# Patient Record
Sex: Female | Born: 1985 | Race: White | Hispanic: No | Marital: Married
Health system: Southern US, Community
[De-identification: ages and names within clinical notes are randomized; demographics above are authoritative.]

## PROBLEM LIST (undated history)

## (undated) ENCOUNTER — Inpatient Hospital Stay (HOSPITAL_COMMUNITY): Payer: Self-pay

## (undated) DIAGNOSIS — G809 Cerebral palsy, unspecified: Secondary | ICD-10-CM

## (undated) DIAGNOSIS — E0590 Thyrotoxicosis, unspecified without thyrotoxic crisis or storm: Secondary | ICD-10-CM

## (undated) DIAGNOSIS — O139 Gestational [pregnancy-induced] hypertension without significant proteinuria, unspecified trimester: Secondary | ICD-10-CM

## (undated) DIAGNOSIS — L709 Acne, unspecified: Secondary | ICD-10-CM

## (undated) DIAGNOSIS — E039 Hypothyroidism, unspecified: Secondary | ICD-10-CM

## (undated) DIAGNOSIS — Z8759 Personal history of other complications of pregnancy, childbirth and the puerperium: Secondary | ICD-10-CM

## (undated) DIAGNOSIS — J4520 Mild intermittent asthma, uncomplicated: Secondary | ICD-10-CM

## (undated) HISTORY — DX: Mild intermittent asthma, uncomplicated: J45.20

## (undated) HISTORY — DX: Personal history of other complications of pregnancy, childbirth and the puerperium: Z87.59

## (undated) HISTORY — PX: OTHER SURGICAL HISTORY: SHX169

## (undated) HISTORY — DX: Acne, unspecified: L70.9

---

## 2017-04-05 ENCOUNTER — Encounter: Payer: Self-pay | Admitting: Obstetrics and Gynecology

## 2017-04-05 ENCOUNTER — Ambulatory Visit (INDEPENDENT_AMBULATORY_CARE_PROVIDER_SITE_OTHER): Payer: BLUE CROSS/BLUE SHIELD | Admitting: Obstetrics and Gynecology

## 2017-04-05 VITALS — BP 123/77 | HR 77 | Ht 62.0 in | Wt 155.6 lb

## 2017-04-05 DIAGNOSIS — Z01419 Encounter for gynecological examination (general) (routine) without abnormal findings: Secondary | ICD-10-CM

## 2017-04-05 MED ORDER — NORELGESTROMIN-ETH ESTRADIOL 150-35 MCG/24HR TD PTWK: 1.0000 | MEDICATED_PATCH | TRANSDERMAL | 5 refills | Status: DC

## 2017-04-05 NOTE — Progress Notes (Signed)
Patient is in the office to get her annual exam and discuss birth control. Patient is interested in a long term birth control- she is getting married soon.

## 2017-04-05 NOTE — Progress Notes (Signed)
Subjective:     Nicole Vance is a 31 y.o. female G0 with BMI 28 who is here for a comprehensive physical exam. The patient reports no problems. She is sexually active using condoms for contraception. She desires STD testing. She denies pelvic pain or abnormal discharge. She reports a monthly period lasting 4-6 days. She is interested in contraception at this time. She has taken OCP without complications in the past. She is requesting STD screen. Patient is getting married on 05/01/2017  Past Medical History:  Diagnosis Date  . Acne   . Asthma in adult, mild intermittent, uncomplicated    with exercise   Past Surgical History:  Procedure Laterality Date  . oral surgery     Family History  Problem Relation Age of Onset  . Diabetes Mellitus II Paternal Grandmother   . Diabetes Mellitus II Paternal Grandfather     Social History   Social History  . Marital status: Single    Spouse name: N/A  . Number of children: N/A  . Years of education: N/A   Occupational History  . Not on file.   Social History Main Topics  . Smoking status: Never Smoker  . Smokeless tobacco: Never Used  . Alcohol use 1.2 oz/week    2 Glasses of wine per week  . Drug use: No  . Sexual activity: Yes    Partners: Male    Birth control/ protection: Condom   Other Topics Concern  . Not on file   Social History Narrative  . No narrative on file   Health Maintenance  Topic Date Due  . HIV Screening  08/29/2003  . TETANUS/TDAP  08/29/2007  . PAP SMEAR  08/28/2009  . INFLUENZA VACCINE  07/07/2017       Review of Systems Pertinent items are noted in HPI.   Objective:  Blood pressure 123/77, pulse 77, height  (1.575 m), weight 155 lb 9.6 oz (70.6 kg), last menstrual period 03/16/2017.     GENERAL: Well-developed, well-nourished female in no acute distress.  HEENT: Normocephalic, atraumatic. Sclerae anicteric.  NECK: Supple. Normal thyroid.  LUNGS: Clear to auscultation bilaterally.   HEART: Regular rate and rhythm. BREASTS: Symmetric in size. No palpable masses or lymphadenopathy, skin changes, or nipple drainage. ABDOMEN: Soft, nontender, nondistended.  PELVIC: Normal external female genitalia. Vagina is pink and rugated.  Normal discharge. Normal appearing cervix. Uterus is normal in size. No adnexal mass or tenderness. EXTREMITIES: No cyanosis, clubbing, or edema, 2+ distal pulses.    Assessment:    Healthy female exam.      Plan:    pap smear collected STD screen ordered Patient will be contacted with abnormal results NuvaRing sample provided to try. Patient is interested in [facility]. Rx also provided but patient may call to request Rx for NuvaRing instead See After Visit Summary for Counseling Recommendations

## 2017-04-06 LAB — RPR: RPR Ser Ql: NONREACTIVE

## 2017-04-06 LAB — CERVICOVAGINAL ANCILLARY ONLY
Bacterial vaginitis: NEGATIVE
CANDIDA VAGINITIS: NEGATIVE
CHLAMYDIA, DNA PROBE: NEGATIVE
NEISSERIA GONORRHEA: NEGATIVE
TRICH (WINDOWPATH): NEGATIVE

## 2017-04-06 LAB — CYTOLOGY - PAP: Diagnosis: NEGATIVE

## 2017-04-06 LAB — HEPATITIS C ANTIBODY: Hep C Virus Ab: <0.1 s/co ratio (ref 0.0–0.9)

## 2017-04-06 LAB — HIV ANTIBODY (ROUTINE TESTING W REFLEX): HIV Screen 4th Generation wRfx: NONREACTIVE

## 2017-04-06 LAB — HEPATITIS B SURFACE ANTIGEN: Hepatitis B Surface Ag: NEGATIVE

## 2017-04-07 DIAGNOSIS — L219 Seborrheic dermatitis, unspecified: Secondary | ICD-10-CM | POA: Diagnosis not present

## 2017-04-07 DIAGNOSIS — L700 Acne vulgaris: Secondary | ICD-10-CM | POA: Diagnosis not present

## 2017-04-07 DIAGNOSIS — Z79899 Other long term (current) drug therapy: Secondary | ICD-10-CM | POA: Diagnosis not present

## 2017-07-25 DIAGNOSIS — J029 Acute pharyngitis, unspecified: Secondary | ICD-10-CM | POA: Diagnosis not present

## 2017-11-01 DIAGNOSIS — J00 Acute nasopharyngitis [common cold]: Secondary | ICD-10-CM | POA: Diagnosis not present

## 2018-06-15 DIAGNOSIS — Z411 Encounter for cosmetic surgery: Secondary | ICD-10-CM | POA: Diagnosis not present

## 2018-06-15 DIAGNOSIS — L738 Other specified follicular disorders: Secondary | ICD-10-CM | POA: Diagnosis not present

## 2018-10-11 DIAGNOSIS — J00 Acute nasopharyngitis [common cold]: Secondary | ICD-10-CM | POA: Diagnosis not present

## 2018-10-11 DIAGNOSIS — Z23 Encounter for immunization: Secondary | ICD-10-CM | POA: Diagnosis not present

## 2019-03-22 DIAGNOSIS — N3000 Acute cystitis without hematuria: Secondary | ICD-10-CM | POA: Diagnosis not present

## 2019-08-08 DIAGNOSIS — Z01419 Encounter for gynecological examination (general) (routine) without abnormal findings: Secondary | ICD-10-CM | POA: Diagnosis not present

## 2019-09-07 DIAGNOSIS — M25531 Pain in right wrist: Secondary | ICD-10-CM | POA: Diagnosis not present

## 2019-09-07 DIAGNOSIS — M65831 Other synovitis and tenosynovitis, right forearm: Secondary | ICD-10-CM | POA: Diagnosis not present

## 2019-09-07 DIAGNOSIS — M65839 Other synovitis and tenosynovitis, unspecified forearm: Secondary | ICD-10-CM | POA: Diagnosis not present

## 2019-10-09 DIAGNOSIS — M65831 Other synovitis and tenosynovitis, right forearm: Secondary | ICD-10-CM | POA: Diagnosis not present

## 2019-10-09 DIAGNOSIS — M25531 Pain in right wrist: Secondary | ICD-10-CM | POA: Diagnosis not present

## 2020-08-29 LAB — OB RESULTS CONSOLE RPR: RPR: NONREACTIVE

## 2020-08-29 LAB — OB RESULTS CONSOLE GC/CHLAMYDIA
Chlamydia: NEGATIVE
Gonorrhea: NEGATIVE

## 2020-08-29 LAB — OB RESULTS CONSOLE HIV ANTIBODY (ROUTINE TESTING): HIV: NONREACTIVE

## 2020-08-29 LAB — OB RESULTS CONSOLE RUBELLA ANTIBODY, IGM: Rubella: IMMUNE

## 2020-08-29 LAB — OB RESULTS CONSOLE HEPATITIS B SURFACE ANTIGEN: Hepatitis B Surface Ag: NEGATIVE

## 2020-12-07 NOTE — L&D Delivery Note (Signed)
Patient was C/C/+2 and pushed for approximately 2 hours with epidural.  As baby was crowning, fetal heart tones were suspected to drop to the 90s, episiotomy was performed to expedite delivery d/t soft tissue dytocia.  Baby delivered with one push after this was performed. Nuchal cord x 1 noted.  He was bulb suctioned for thick mucus and place on maternal abdomen for drying. Delayed cord clamping done for <30 seconds while warming baby. He remained somewhat limp without spontaneous cry, so cord was clamped and cut and infant was handed off to awaiting neonatologist for add'l care. NSVD female infant "[name]", Apgars 6/8, weight 5#13.   The patient had R mediolateral episiotomy repaired with 2-0 vicryl and a left outer labial laceration repaired with 3-0 vicryl. Fundus was firm. EBL was expected amount. Placenta was delivered intact. Vagina was clear.    Nicole Vance

## 2021-02-18 ENCOUNTER — Inpatient Hospital Stay (HOSPITAL_COMMUNITY)
Admission: AD | Admit: 2021-02-18 | Discharge: 2021-02-25 | DRG: 806 | Disposition: A | Discharge status: Home / Self Care | Payer: 59 | Attending: Obstetrics and Gynecology | Admitting: Obstetrics and Gynecology

## 2021-02-18 ENCOUNTER — Encounter (HOSPITAL_COMMUNITY): Payer: Self-pay

## 2021-02-18 DIAGNOSIS — O1413 Severe pre-eclampsia, third trimester: Principal | ICD-10-CM | POA: Diagnosis present

## 2021-02-18 DIAGNOSIS — Z3490 Encounter for supervision of normal pregnancy, unspecified, unspecified trimester: Secondary | ICD-10-CM

## 2021-02-18 DIAGNOSIS — O99354 Diseases of the nervous system complicating childbirth: Secondary | ICD-10-CM | POA: Diagnosis present

## 2021-02-18 DIAGNOSIS — O1414 Severe pre-eclampsia complicating childbirth: Principal | ICD-10-CM | POA: Diagnosis present

## 2021-02-18 DIAGNOSIS — G809 Cerebral palsy, unspecified: Secondary | ICD-10-CM | POA: Diagnosis present

## 2021-02-18 DIAGNOSIS — Z3A35 35 weeks gestation of pregnancy: Secondary | ICD-10-CM

## 2021-02-18 DIAGNOSIS — R030 Elevated blood-pressure reading, without diagnosis of hypertension: Secondary | ICD-10-CM | POA: Diagnosis present

## 2021-02-18 HISTORY — DX: Cerebral palsy, unspecified: G80.9

## 2021-02-18 LAB — URINALYSIS, ROUTINE W REFLEX MICROSCOPIC
Bilirubin Urine: NEGATIVE
Glucose, UA: NEGATIVE mg/dL
Hgb urine dipstick: NEGATIVE
Ketones, ur: NEGATIVE mg/dL
Nitrite: NEGATIVE
Protein, ur: NEGATIVE mg/dL
Specific Gravity, Urine: 1.008 (ref 1.005–1.030)
pH: 7 (ref 5.0–8.0)

## 2021-02-18 LAB — COMPREHENSIVE METABOLIC PANEL
ALT: 17 U/L (ref 0–44)
AST: 19 U/L (ref 15–41)
Albumin: 3.1 g/dL — ABNORMAL LOW (ref 3.5–5.0)
Alkaline Phosphatase: 79 U/L (ref 38–126)
Anion gap: 9 (ref 5–15)
BUN: 11 mg/dL (ref 6–20)
CO2: 20 mmol/L — ABNORMAL LOW (ref 22–32)
Calcium: 9.8 mg/dL (ref 8.9–10.3)
Chloride: 104 mmol/L (ref 98–111)
Creatinine, Ser: 0.78 mg/dL (ref 0.44–1.00)
GFR, Estimated: >60 mL/min (ref >60)
Glucose, Bld: 87 mg/dL (ref 70–99)
Potassium: 3.7 mmol/L (ref 3.5–5.1)
Sodium: 133 mmol/L — ABNORMAL LOW (ref 135–145)
Total Bilirubin: 0.4 mg/dL (ref 0.3–1.2)
Total Protein: 6.1 g/dL — ABNORMAL LOW (ref 6.5–8.1)

## 2021-02-18 LAB — CBC
HCT: 34 % — ABNORMAL LOW (ref 36.0–46.0)
Hemoglobin: 12.3 g/dL (ref 12.0–15.0)
MCH: 32.4 pg (ref 26.0–34.0)
MCHC: 36.2 g/dL — ABNORMAL HIGH (ref 30.0–36.0)
MCV: 89.5 fL (ref 80.0–100.0)
Platelets: 200 10*3/uL (ref 150–400)
RBC: 3.8 MIL/uL — ABNORMAL LOW (ref 3.87–5.11)
RDW: 12.6 % (ref 11.5–15.5)
WBC: 7 10*3/uL (ref 4.0–10.5)
nRBC: 0 % (ref 0.0–0.2)

## 2021-02-18 LAB — TYPE AND SCREEN
ABO/RH(D): O POS
Antibody Screen: NEGATIVE

## 2021-02-18 LAB — PROTEIN / CREATININE RATIO, URINE
Creatinine, Urine: 54.87 mg/dL
Total Protein, Urine: <6 mg/dL

## 2021-02-18 MED ORDER — ZOLPIDEM TARTRATE 5 MG PO TABS: 5.0000 mg | ORAL_TABLET | Freq: Every evening | ORAL | Status: DC | PRN

## 2021-02-18 MED ORDER — ACETAMINOPHEN 325 MG PO TABS
650.0000 mg | ORAL_TABLET | ORAL | Status: DC | PRN
  Administered 2021-02-19 – 2021-02-22 (×4): 650 mg via ORAL
  Filled 2021-02-18 (×4): qty 2

## 2021-02-18 MED ORDER — LABETALOL HCL 5 MG/ML IV SOLN
INTRAVENOUS | Status: AC
  Administered 2021-02-19: 20 mg via INTRAVENOUS
  Filled 2021-02-18: qty 4

## 2021-02-18 MED ORDER — CALCIUM CARBONATE ANTACID 500 MG PO CHEW: 2.0000 | CHEWABLE_TABLET | ORAL | Status: DC | PRN

## 2021-02-18 MED ORDER — HYDRALAZINE HCL 20 MG/ML IJ SOLN
5.0000 mg | INTRAMUSCULAR | Status: DC | PRN
  Administered 2021-02-19: 5 mg via INTRAVENOUS
  Filled 2021-02-18: qty 1

## 2021-02-18 MED ORDER — DOCUSATE SODIUM 100 MG PO CAPS
100.0000 mg | ORAL_CAPSULE | Freq: Every day | ORAL | Status: DC
  Administered 2021-02-21: 100 mg via ORAL
  Filled 2021-02-18: qty 1

## 2021-02-18 MED ORDER — LABETALOL HCL 5 MG/ML IV SOLN
20.0000 mg | INTRAVENOUS | Status: DC | PRN
  Administered 2021-02-18: 20 mg via INTRAVENOUS

## 2021-02-18 MED ORDER — PRENATAL MULTIVITAMIN CH
1.0000 | ORAL_TABLET | Freq: Every day | ORAL | Status: DC
Start: 2021-02-19 — End: 2021-02-21
  Administered 2021-02-20: 1 via ORAL
  Filled 2021-02-18: qty 1

## 2021-02-18 MED ORDER — LABETALOL HCL 5 MG/ML IV SOLN
40.0000 mg | INTRAVENOUS | Status: DC | PRN
  Administered 2021-02-18: 40 mg via INTRAVENOUS
  Filled 2021-02-18: qty 8

## 2021-02-18 MED ORDER — HYDRALAZINE HCL 20 MG/ML IJ SOLN
10.0000 mg | INTRAMUSCULAR | Status: DC | PRN
  Administered 2021-02-19: 10 mg via INTRAVENOUS
  Filled 2021-02-18: qty 1

## 2021-02-18 NOTE — MAU Provider Note (Addendum)
Chief Complaint:  Hypertension   Event Date/Time   First Provider Initiated Contact with Patient 02/18/21 2050     HPI: Nicole Vance is a 34 y.o. G1P0 at 35w3dwho presents to maternity admissions reporting elevated blood pressure at home, severe range.  She reports good fetal movement, denies LOF, vaginal bleeding, vaginal itching/burning, urinary symptoms, h/a, dizziness, n/v, diarrhea, constipation or fever/chills.  She denies headache, visual changes or RUQ abdominal pain.  Hypertension This is a recurrent problem. The current episode started today. The problem has been gradually worsening since onset. Pertinent negatives include no anxiety, blurred vision, chest pain, headaches (has had some "very mild ones", none now), malaise/fatigue or peripheral edema. There are no associated agents to hypertension. There are no known risk factors for coronary artery disease. Past treatments include nothing. There are no compliance problems.     RN Note: Nicole Vance is a 34 y.o. at Unknown here in MAU reporting: took blood pressure at home and it was 168/106. Denies headache, visual changes, or epigastric pain. +FM. Denies vaginal bleeding, leaking of fluid, or contractions.  Pain score: 0/10  Past Medical History: Has had some mild elevations of BP in office recently  Past obstetric history: OB History  Gravida Para Term Preterm AB Living  1            SAB IAB Ectopic Multiple Live Births               # Outcome Date GA Lbr Len/2nd Weight Sex Delivery Anes PTL Lv  1 Current             Past Surgical History: History reviewed. No pertinent surgical history.  Family History: History reviewed. No pertinent family history.  Social History:    Allergies: No Known Allergies  Meds:  No medications prior to admission.    I have reviewed patient's Past Medical Hx, Surgical Hx, Family Hx, Social Hx, medications and allergies.   ROS:  Review of Systems  Constitutional: Negative for  malaise/fatigue.  Eyes: Negative for blurred vision.  Cardiovascular: Negative for chest pain.  Neurological: Negative for headaches (has had some "very mild ones", none now).   Other systems negative  Physical Exam   Patient Vitals for the past 24 hrs:  BP Temp Temp src Pulse Resp SpO2 Height Weight  02/18/21 2140 -- -- -- -- -- 99 % -- --  02/18/21 2135 -- -- -- -- -- 98 % -- --  02/18/21 2131 (!) 163/101 -- -- 87 -- -- -- --  02/18/21 2130 -- -- -- -- -- 98 % -- --  02/18/21 2125 -- -- -- -- -- 97 % -- --  02/18/21 2120 -- -- -- -- -- 98 % -- --  02/18/21 2116 (!) 172/109 -- -- 99 -- -- -- --  02/18/21 2115 -- -- -- -- -- 98 % -- --  02/18/21 2110 -- -- -- -- -- 97 % -- --  02/18/21 2105 -- -- -- -- -- 96 % -- --  02/18/21 2101 (!) 169/103 -- -- 98 -- -- -- --  02/18/21 2100 -- -- -- -- -- 96 % -- --  02/18/21 2045 (!) 170/104 -- -- -- -- -- -- --  02/18/21 2042 (!) 175/111 98.5 F (36.9 C) Oral 97 17 98 % 5' 2" (1.575 m) 91.7 kg   Constitutional: Well-developed, well-nourished female in no acute distress.  Cardiovascular: normal rate and rhythm Respiratory: normal effort GI: Abd soft, non-tender, gravid appropriate   for gestational age.   No rebound or guarding. MS: Extremities nontender, Trace pedal edema, normal ROM Neurologic: Alert and oriented x 4. DTRs 2-3+ with no clonus GU: Neg CVAT.  PELVIC EXAM: deferred   FHT:  Baseline 140 , moderate variability, accelerations present, no decelerations Contractions: Uterine irritability   Labs: Results for orders placed or performed during the hospital encounter of 02/18/21 (from the past 24 hour(s))  CBC     Status: Abnormal   Collection Time: 02/18/21  8:36 PM  Result Value Ref Range   WBC 7.0 4.0 - 10.5 K/uL   RBC 3.80 (L) 3.87 - 5.11 MIL/uL   Hemoglobin 12.3 12.0 - 15.0 g/dL   HCT 34.0 (L) 36.0 - 46.0 %   MCV 89.5 80.0 - 100.0 fL   MCH 32.4 26.0 - 34.0 pg   MCHC 36.2 (H) 30.0 - 36.0 g/dL   RDW 12.6 11.5 - 15.5 %    Platelets 200 150 - 400 K/uL   nRBC 0.0 0.0 - 0.2 %  Comprehensive metabolic panel     Status: Abnormal   Collection Time: 02/18/21  8:36 PM  Result Value Ref Range   Sodium 133 (L) 135 - 145 mmol/L   Potassium 3.7 3.5 - 5.1 mmol/L   Chloride 104 98 - 111 mmol/L   CO2 20 (L) 22 - 32 mmol/L   Glucose, Bld 87 70 - 99 mg/dL   BUN 11 6 - 20 mg/dL   Creatinine, Ser 0.78 0.44 - 1.00 mg/dL   Calcium 9.8 8.9 - 10.3 mg/dL   Total Protein 6.1 (L) 6.5 - 8.1 g/dL   Albumin 3.1 (L) 3.5 - 5.0 g/dL   AST 19 15 - 41 U/L   ALT 17 0 - 44 U/L   Alkaline Phosphatase 79 38 - 126 U/L   Total Bilirubin 0.4 0.3 - 1.2 mg/dL   GFR, Estimated >60 >60 mL/min   Anion gap 9 5 - 15  Type and screen     Status: None (Preliminary result)   Collection Time: 02/18/21  9:25 PM  Result Value Ref Range   ABO/RH(D) PENDING    Antibody Screen PENDING    Sample Expiration      02/21/2021,2359 Performed at [facility], [address] [place], [place] [address]    Protein/creat Ratio pending  Imaging:  No results found.  MAU Course/MDM: I have ordered labs and reviewed results. CMET and protein pending NST reviewed, reactive Consult Dr Horvath with presentation, exam findings and test results.  Treatments in MAU included Severe Preeclampsia Protocol.    Assessment: 1. Preeclampsia, severe, third trimester   2.     Single IUP at [redacted]w[redacted]d   Plan: Admit to OB Specialty Care Unit WIll observe overnight and MD will reevaluate in AM MD to follow    CNM, MSN Certified Nurse-Midwife 02/18/2021 10:07 PM   

## 2021-02-18 NOTE — MAU Note (Signed)
..  Nicole Vance is a 35 y.o. at Unknown here in MAU reporting: took blood pressure at home and it was 168/106. Denies headache, visual changes, or epigastric pain. +FM. Denies vaginal bleeding, leaking of fluid, or contractions.  Pain score: 0/10 Vitals:   02/18/21 2042 02/18/21 2045  BP: (!) 175/111 (!) 170/104  Pulse: 97   Resp: 17   Temp: 98.5 F (36.9 C)   SpO2: 98%      FHT: 140 doppler  Lab orders placed from triage: UA

## 2021-02-18 NOTE — H&P (Signed)
Chief Complaint:  Hypertension   Event Date/Time   First Provider Initiated Contact with Patient 02/18/21 2050     HPI: Nicole Vance is a 35 y.o. G1P0 at 45w3dwho presents to maternity admissions reporting elevated blood pressure at home, severe range.  She reports good fetal movement, denies LOF, vaginal bleeding, vaginal itching/burning, urinary symptoms, h/a, dizziness, n/v, diarrhea, constipation or fever/chills.  She denies headache, visual changes or RUQ abdominal pain.  Hypertension This is a recurrent problem. The current episode started today. The problem has been gradually worsening since onset. Pertinent negatives include no anxiety, blurred vision, chest pain, headaches (has had some "very mild ones", none now), malaise/fatigue or peripheral edema. There are no associated agents to hypertension. There are no known risk factors for coronary artery disease. Past treatments include nothing. There are no compliance problems.     RN Note: Nicole Vance is a 35 y.o. at Unknown here in MAU reporting: took blood pressure at home and it was 168/106. Denies headache, visual changes, or epigastric pain. +FM. Denies vaginal bleeding, leaking of fluid, or contractions.  Pain score: 0/10  Past Medical History: Has had some mild elevations of BP in office recently  Past obstetric history: OB History  Gravida Para Term Preterm AB Living  1            SAB IAB Ectopic Multiple Live Births               # Outcome Date GA Lbr Len/2nd Weight Sex Delivery Anes PTL Lv  1 Current             Past Surgical History: History reviewed. No pertinent surgical history.  Family History: History reviewed. No pertinent family history.  Social History:    Allergies: No Known Allergies  Meds:  No medications prior to admission.    I have reviewed patient's Past Medical Hx, Surgical Hx, Family Hx, Social Hx, medications and allergies.   ROS:  Review of Systems  Constitutional: Negative for  malaise/fatigue.  Eyes: Negative for blurred vision.  Cardiovascular: Negative for chest pain.  Neurological: Negative for headaches (has had some "very mild ones", none now).   Other systems negative  Physical Exam   Patient Vitals for the past 24 hrs:  BP Temp Temp src Pulse Resp SpO2 Height Weight  02/18/21 2140 -- -- -- -- -- 99 % -- --  02/18/21 2135 -- -- -- -- -- 98 % -- --  02/18/21 2131 (!) 163/101 -- -- 87 -- -- -- --  02/18/21 2130 -- -- -- -- -- 98 % -- --  02/18/21 2125 -- -- -- -- -- 97 % -- --  02/18/21 2120 -- -- -- -- -- 98 % -- --  02/18/21 2116 (!) 172/109 -- -- 99 -- -- -- --  02/18/21 2115 -- -- -- -- -- 98 % -- --  02/18/21 2110 -- -- -- -- -- 97 % -- --  02/18/21 2105 -- -- -- -- -- 96 % -- --  02/18/21 2101 (!) 169/103 -- -- 98 -- -- -- --  02/18/21 2100 -- -- -- -- -- 96 % -- --  02/18/21 2045 (!) 170/104 -- -- -- -- -- -- --  02/18/21 2042 (!) 175/111 98.5 F (36.9 C) Oral 97 17 98 % 5\' 2"  (1.575 m) 91.7 kg   Constitutional: Well-developed, well-nourished female in no acute distress.  Cardiovascular: normal rate and rhythm Respiratory: normal effort GI: Abd soft, non-tender, gravid appropriate  for gestational age.   No rebound or guarding. MS: Extremities nontender, Trace pedal edema, normal ROM Neurologic: Alert and oriented x 4. DTRs 2-3+ with no clonus GU: Neg CVAT.  PELVIC EXAM: deferred   FHT:  Baseline 140 , moderate variability, accelerations present, no decelerations Contractions: Uterine irritability   Labs: Results for orders placed or performed during the hospital encounter of 02/18/21 (from the past 24 hour(s))  CBC     Status: Abnormal   Collection Time: 02/18/21  8:36 PM  Result Value Ref Range   WBC 7.0 4.0 - 10.5 K/uL   RBC 3.80 (L) 3.87 - 5.11 MIL/uL   Hemoglobin 12.3 12.0 - 15.0 g/dL   HCT 45.6 (L) 25.6 - 38.9 %   MCV 89.5 80.0 - 100.0 fL   MCH 32.4 26.0 - 34.0 pg   MCHC 36.2 (H) 30.0 - 36.0 g/dL   RDW 37.3 42.8 - 76.8 %    Platelets 200 150 - 400 K/uL   nRBC 0.0 0.0 - 0.2 %  Comprehensive metabolic panel     Status: Abnormal   Collection Time: 02/18/21  8:36 PM  Result Value Ref Range   Sodium 133 (L) 135 - 145 mmol/L   Potassium 3.7 3.5 - 5.1 mmol/L   Chloride 104 98 - 111 mmol/L   CO2 20 (L) 22 - 32 mmol/L   Glucose, Bld 87 70 - 99 mg/dL   BUN 11 6 - 20 mg/dL   Creatinine, Ser 1.15 0.44 - 1.00 mg/dL   Calcium 9.8 8.9 - 72.6 mg/dL   Total Protein 6.1 (L) 6.5 - 8.1 g/dL   Albumin 3.1 (L) 3.5 - 5.0 g/dL   AST 19 15 - 41 U/L   ALT 17 0 - 44 U/L   Alkaline Phosphatase 79 38 - 126 U/L   Total Bilirubin 0.4 0.3 - 1.2 mg/dL   GFR, Estimated >20 >35 mL/min   Anion gap 9 5 - 15  Type and screen     Status: None (Preliminary result)   Collection Time: 02/18/21  9:25 PM  Result Value Ref Range   ABO/RH(D) PENDING    Antibody Screen PENDING    Sample Expiration      02/21/2021,2359 Performed at [facility], [address], [place], [place] [address]    Protein/creat Ratio pending  Imaging:  No results found.  MAU Course/MDM: I have ordered labs and reviewed results. CMET and protein pending NST reviewed, reactive Consult Dr Henderson Cloud with presentation, exam findings and test results.  Treatments in MAU included Severe Preeclampsia Protocol.    Assessment: 1. Preeclampsia, severe, third trimester   2.     Single IUP at [redacted]w[redacted]d   Plan: Admit to [facility] Special[facility] Care Unit WIll observe overnight and MD will reevaluate in AM MD to follow  [name] CNM, MSN Certified Nurse-Midwife 02/18/2021 10:07 PM

## 2021-02-19 ENCOUNTER — Encounter (HOSPITAL_COMMUNITY): Payer: Self-pay | Admitting: Obstetrics and Gynecology

## 2021-02-19 ENCOUNTER — Other Ambulatory Visit: Payer: Self-pay

## 2021-02-19 DIAGNOSIS — Z3490 Encounter for supervision of normal pregnancy, unspecified, unspecified trimester: Secondary | ICD-10-CM

## 2021-02-19 LAB — COMPREHENSIVE METABOLIC PANEL
ALT: 17 U/L (ref 0–44)
ALT: 17 U/L (ref 0–44)
AST: 22 U/L (ref 15–41)
AST: 18 U/L (ref 15–41)
Albumin: 3.1 g/dL — ABNORMAL LOW (ref 3.5–5.0)
Albumin: 3 g/dL — ABNORMAL LOW (ref 3.5–5.0)
Alkaline Phosphatase: 83 U/L (ref 38–126)
Alkaline Phosphatase: 72 U/L (ref 38–126)
Anion gap: 11 (ref 5–15)
Anion gap: 10 (ref 5–15)
BUN: <5 mg/dL — ABNORMAL LOW (ref 6–20)
BUN: <5 mg/dL — ABNORMAL LOW (ref 6–20)
CO2: 19 mmol/L — ABNORMAL LOW (ref 22–32)
CO2: 21 mmol/L — ABNORMAL LOW (ref 22–32)
Calcium: 8.9 mg/dL (ref 8.9–10.3)
Calcium: 7.7 mg/dL — ABNORMAL LOW (ref 8.9–10.3)
Chloride: 105 mmol/L (ref 98–111)
Chloride: 102 mmol/L (ref 98–111)
Creatinine, Ser: 0.6 mg/dL (ref 0.44–1.00)
Creatinine, Ser: 0.7 mg/dL (ref 0.44–1.00)
GFR, Estimated: >60 mL/min (ref >60)
GFR, Estimated: >60 mL/min (ref >60)
Glucose, Bld: 103 mg/dL — ABNORMAL HIGH (ref 70–99)
Glucose, Bld: 98 mg/dL (ref 70–99)
Potassium: 3.9 mmol/L (ref 3.5–5.1)
Potassium: 4.2 mmol/L (ref 3.5–5.1)
Sodium: 135 mmol/L (ref 135–145)
Sodium: 133 mmol/L — ABNORMAL LOW (ref 135–145)
Total Bilirubin: 0.5 mg/dL (ref 0.3–1.2)
Total Bilirubin: 0.5 mg/dL (ref 0.3–1.2)
Total Protein: 6.3 g/dL — ABNORMAL LOW (ref 6.5–8.1)
Total Protein: 6.1 g/dL — ABNORMAL LOW (ref 6.5–8.1)

## 2021-02-19 LAB — CBC
HCT: 33.9 % — ABNORMAL LOW (ref 36.0–46.0)
HCT: 35.4 % — ABNORMAL LOW (ref 36.0–46.0)
Hemoglobin: 12.4 g/dL (ref 12.0–15.0)
Hemoglobin: 12.5 g/dL (ref 12.0–15.0)
MCH: 32.5 pg (ref 26.0–34.0)
MCH: 32 pg (ref 26.0–34.0)
MCHC: 36.6 g/dL — ABNORMAL HIGH (ref 30.0–36.0)
MCHC: 35.3 g/dL (ref 30.0–36.0)
MCV: 89 fL (ref 80.0–100.0)
MCV: 90.5 fL (ref 80.0–100.0)
Platelets: 202 10*3/uL (ref 150–400)
Platelets: 188 10*3/uL (ref 150–400)
RBC: 3.81 MIL/uL — ABNORMAL LOW (ref 3.87–5.11)
RBC: 3.91 MIL/uL (ref 3.87–5.11)
RDW: 12.9 % (ref 11.5–15.5)
RDW: 12.7 % (ref 11.5–15.5)
WBC: 6.9 10*3/uL (ref 4.0–10.5)
WBC: 10.3 10*3/uL (ref 4.0–10.5)
nRBC: 0 % (ref 0.0–0.2)
nRBC: 0 % (ref 0.0–0.2)

## 2021-02-19 LAB — GROUP B STREP BY PCR: Group B strep by PCR: NEGATIVE

## 2021-02-19 MED ORDER — OXYTOCIN-SODIUM CHLORIDE 30-0.9 UT/500ML-% IV SOLN: 2.5000 [IU]/h | INTRAVENOUS | Status: DC

## 2021-02-19 MED ORDER — OXYCODONE-ACETAMINOPHEN 5-325 MG PO TABS: 2.0000 | ORAL_TABLET | ORAL | Status: DC | PRN

## 2021-02-19 MED ORDER — LACTATED RINGERS IV SOLN: INTRAVENOUS | Status: DC

## 2021-02-19 MED ORDER — LIDOCAINE HCL (PF) 1 % IJ SOLN
30.0000 mL | INTRAMUSCULAR | Status: AC | PRN
  Administered 2021-02-21: 30 mL via SUBCUTANEOUS
  Filled 2021-02-19: qty 30

## 2021-02-19 MED ORDER — DIPHENHYDRAMINE HCL 50 MG/ML IJ SOLN
12.5000 mg | Freq: Once | INTRAMUSCULAR | Status: AC
  Administered 2021-02-19: 12.5 mg via INTRAVENOUS
  Filled 2021-02-19: qty 1

## 2021-02-19 MED ORDER — ONDANSETRON HCL 4 MG/2ML IJ SOLN
4.0000 mg | Freq: Four times a day (QID) | INTRAMUSCULAR | Status: DC | PRN
  Administered 2021-02-20 – 2021-02-21 (×2): 4 mg via INTRAVENOUS
  Filled 2021-02-19 (×2): qty 2

## 2021-02-19 MED ORDER — TERBUTALINE SULFATE 1 MG/ML IJ SOLN: 0.2500 mg | Freq: Once | INTRAMUSCULAR | Status: DC | PRN

## 2021-02-19 MED ORDER — OXYTOCIN-SODIUM CHLORIDE 30-0.9 UT/500ML-% IV SOLN
1.0000 m[IU]/min | INTRAVENOUS | Status: DC
  Administered 2021-02-19: 2 m[IU]/min via INTRAVENOUS
  Administered 2021-02-20: 10 m[IU]/min via INTRAVENOUS
  Administered 2021-02-20: 18 m[IU]/min via INTRAVENOUS
  Filled 2021-02-19 (×3): qty 500

## 2021-02-19 MED ORDER — OXYCODONE-ACETAMINOPHEN 5-325 MG PO TABS: 1.0000 | ORAL_TABLET | ORAL | Status: DC | PRN

## 2021-02-19 MED ORDER — ACETAMINOPHEN 325 MG PO TABS: 650.0000 mg | ORAL_TABLET | ORAL | Status: DC | PRN

## 2021-02-19 MED ORDER — FENTANYL CITRATE (PF) 100 MCG/2ML IJ SOLN
50.0000 ug | Freq: Once | INTRAMUSCULAR | Status: AC
  Administered 2021-02-19: 50 ug via INTRAVENOUS
  Filled 2021-02-19: qty 2

## 2021-02-19 MED ORDER — BETAMETHASONE SOD PHOS & ACET 6 (3-3) MG/ML IJ SUSP
12.0000 mg | Freq: Once | INTRAMUSCULAR | Status: AC
  Administered 2021-02-20: 12 mg via INTRAMUSCULAR

## 2021-02-19 MED ORDER — SOD CITRATE-CITRIC ACID 500-334 MG/5ML PO SOLN: 30.0000 mL | ORAL | Status: DC | PRN

## 2021-02-19 MED ORDER — MISOPROSTOL 25 MCG QUARTER TABLET
25.0000 ug | ORAL_TABLET | ORAL | Status: DC | PRN
  Administered 2021-02-19 (×3): 25 ug via VAGINAL
  Filled 2021-02-19 (×3): qty 1

## 2021-02-19 MED ORDER — MAGNESIUM SULFATE BOLUS VIA INFUSION
4.0000 g | Freq: Once | INTRAVENOUS | Status: AC
  Administered 2021-02-19: 4 g via INTRAVENOUS
  Filled 2021-02-19: qty 1000

## 2021-02-19 MED ORDER — PROCHLORPERAZINE EDISYLATE 10 MG/2ML IJ SOLN
10.0000 mg | Freq: Once | INTRAMUSCULAR | Status: AC
  Administered 2021-02-19: 10 mg via INTRAVENOUS
  Filled 2021-02-19: qty 2

## 2021-02-19 MED ORDER — LABETALOL HCL 5 MG/ML IV SOLN
80.0000 mg | INTRAVENOUS | Status: DC | PRN
  Administered 2021-02-23: 80 mg via INTRAVENOUS
  Filled 2021-02-19: qty 16

## 2021-02-19 MED ORDER — OXYTOCIN BOLUS FROM INFUSION
333.0000 mL | Freq: Once | INTRAVENOUS | Status: AC
  Administered 2021-02-21: 333 mL via INTRAVENOUS

## 2021-02-19 MED ORDER — MAGNESIUM SULFATE 40 GM/1000ML IV SOLN
2.0000 g/h | INTRAVENOUS | Status: AC
  Administered 2021-02-20 – 2021-02-21 (×3): 2 g/h via INTRAVENOUS
  Filled 2021-02-19 (×4): qty 1000

## 2021-02-19 MED ORDER — NIFEDIPINE ER OSMOTIC RELEASE 30 MG PO TB24
30.0000 mg | ORAL_TABLET | Freq: Every day | ORAL | Status: DC
  Filled 2021-02-19: qty 1

## 2021-02-19 MED ORDER — LABETALOL HCL 5 MG/ML IV SOLN
20.0000 mg | INTRAVENOUS | Status: DC | PRN
  Administered 2021-02-23: 20 mg via INTRAVENOUS
  Filled 2021-02-19 (×2): qty 4

## 2021-02-19 MED ORDER — HYDRALAZINE HCL 20 MG/ML IJ SOLN
10.0000 mg | INTRAMUSCULAR | Status: DC | PRN
Start: 2021-02-19 — End: 2021-02-25

## 2021-02-19 MED ORDER — LABETALOL HCL 5 MG/ML IV SOLN
40.0000 mg | INTRAVENOUS | Status: DC | PRN
  Administered 2021-02-23: 40 mg via INTRAVENOUS
  Filled 2021-02-19: qty 8

## 2021-02-19 MED ORDER — FENTANYL CITRATE (PF) 100 MCG/2ML IJ SOLN
50.0000 ug | INTRAMUSCULAR | Status: DC | PRN
Start: 2021-02-19 — End: 2021-02-25
  Administered 2021-02-19 (×2): 50 ug via INTRAVENOUS
  Filled 2021-02-19 (×2): qty 2

## 2021-02-19 MED ORDER — LACTATED RINGERS IV SOLN
500.0000 mL | INTRAVENOUS | Status: DC | PRN
Start: 2021-02-19 — End: 2021-02-25
  Administered 2021-02-20: 500 mL via INTRAVENOUS

## 2021-02-19 MED ORDER — BETAMETHASONE SOD PHOS & ACET 6 (3-3) MG/ML IJ SUSP
12.0000 mg | INTRAMUSCULAR | Status: DC
  Administered 2021-02-19: 12 mg via INTRAMUSCULAR
  Filled 2021-02-19: qty 5

## 2021-02-19 NOTE — Progress Notes (Signed)
Agree with H&P by CNM.  Briefly, this is a 35 y.o.G1P0 [redacted]w[redacted]d with newly diagnosed gestational hypertension who was taking her blood pressure last night and found it to be about 160/100.  Pt called and told to come in for eval.  On presentation she had no other symptoms of preeclampsia.  FHTS were reactive and pt's blood pressure responded to a second dose of labelatol at 40 mg.    Vitals:   02/19/21 0145 02/19/21 0146 02/19/21 0150 02/19/21 0155  BP:  130/77    Pulse:  77    Resp:      Temp:      TempSrc:      SpO2: 96%  96% 96%  Weight:      Height:       FHTS 120s, gSTV, NST R, cat 1 Toco occ SVE was deferred by CNM.  Results for orders placed or performed during the hospital encounter of 02/18/21 (from the past 24 hour(s))  CBC     Status: Abnormal   Collection Time: 02/18/21  8:36 PM  Result Value Ref Range   WBC 7.0 4.0 - 10.5 K/uL   RBC 3.80 (L) 3.87 - 5.11 MIL/uL   Hemoglobin 12.3 12.0 - 15.0 g/dL   HCT 67.1 (L) 24.5 - 80.9 %   MCV 89.5 80.0 - 100.0 fL   MCH 32.4 26.0 - 34.0 pg   MCHC 36.2 (H) 30.0 - 36.0 g/dL   RDW 98.3 38.2 - 50.5 %   Platelets 200 150 - 400 K/uL   nRBC 0.0 0.0 - 0.2 %  Comprehensive metabolic panel     Status: Abnormal   Collection Time: 02/18/21  8:36 PM  Result Value Ref Range   Sodium 133 (L) 135 - 145 mmol/L   Potassium 3.7 3.5 - 5.1 mmol/L   Chloride 104 98 - 111 mmol/L   CO2 20 (L) 22 - 32 mmol/L   Glucose, Bld 87 70 - 99 mg/dL   BUN 11 6 - 20 mg/dL   Creatinine, Ser 3.97 0.44 - 1.00 mg/dL   Calcium 9.8 8.9 - 67.3 mg/dL   Total Protein 6.1 (L) 6.5 - 8.1 g/dL   Albumin 3.1 (L) 3.5 - 5.0 g/dL   AST 19 15 - 41 U/L   ALT 17 0 - 44 U/L   Alkaline Phosphatase 79 38 - 126 U/L   Total Bilirubin 0.4 0.3 - 1.2 mg/dL   GFR, Estimated >41 >93 mL/min   Anion gap 9 5 - 15  Protein / creatinine ratio, urine     Status: None   Collection Time: 02/18/21  8:37 PM  Result Value Ref Range   Creatinine, Urine 54.87 mg/dL   Total Protein, Urine <6  mg/dL   Protein Creatinine Ratio        0.00 - 0.15 mg/mg[Cre]  Urinalysis, Routine w reflex microscopic     Status: Abnormal   Collection Time: 02/18/21  8:45 PM  Result Value Ref Range   Color, Urine YELLOW YELLOW   APPearance CLEAR CLEAR   Specific Gravity, Urine 1.008 1.005 - 1.030   pH 7.0 5.0 - 8.0   Glucose, UA NEGATIVE NEGATIVE mg/dL   Hgb urine dipstick NEGATIVE NEGATIVE   Bilirubin Urine NEGATIVE NEGATIVE   Ketones, ur NEGATIVE NEGATIVE mg/dL   Protein, ur NEGATIVE NEGATIVE mg/dL   Nitrite NEGATIVE NEGATIVE   Leukocytes,Ua TRACE (A) NEGATIVE   RBC / HPF 0-5 0 - 5 RBC/hpf   WBC, UA 0-5 0 - 5  WBC/hpf   Bacteria, UA MANY (A) NONE SEEN   Squamous Epithelial / LPF 0-5 0 - 5   Mucus PRESENT   Type and screen     Status: None   Collection Time: 02/18/21  9:25 PM  Result Value Ref Range   ABO/RH(D) O POS    Antibody Screen NEG    Sample Expiration      02/21/2021,2359 Performed at [facility], [address], [place], [place] [address]     Prenatal Transfer Tool  Maternal Diabetes: No Genetic Screening: Normal Maternal Ultrasounds/Referrals: Normal Fetal Ultrasounds or other Referrals:  None Maternal Substance Abuse:  No Significant Maternal Medications:  Meds include: Other: albuterol Significant Maternal Lab Results: None  Pt has cerebal palsy.  A/P [redacted]w[redacted]d with several severe range BPs, now normal.  Labs are all normal and no symptoms of severe. Pt admitted for obs for now, plan on move to labor and delivery if another severe range BP.  BMZ and GBS done in meantime.

## 2021-02-19 NOTE — Progress Notes (Signed)
OBGYN Vitals:   02/19/21 2032 02/19/21 2037 02/19/21 2116 02/19/21 2130  BP: (!) 156/102 (!) 161/107 (!) 153/94 138/74  Pulse: 99 98 90 88  Resp:   18 16  Temp:   98 F (36.7 C)   TempSrc:   Oral   SpO2:      Weight:      Height:       Recent Labs    02/18/21 2036 02/19/21 0857 02/19/21 2011  WBC 7.0 6.9 10.3  HGB 12.3 12.4 12.5  HCT 34.0* 33.9* 35.4*  PLT 200 202 188  NA 133* 135 133*  K 3.7 3.9 4.2  CL 104 105 102  BUN 11 <5* <5*  CREATININE 0.78 0.60 0.70  AST 19 22 18   ALT 17 17 17   BILITOT 0.4 0.5 0.5   FHR 130. Cat 1 Toco irritable   Nicole Vance 35 y.o. G1P0 at [redacted]w[redacted]d IOL due to severe GHTN 1. IOL: SVE on admit 0/thick/high, s/p cytotec x3, FB. On pitocin. Plan for AROM prn -GBS returned negative, PCN stopped 2. Severe GHTN: -Severe range BP: on admit received IV lab and hydral protocol. Was normotensive for most of the day, now severe again, given IV labetalol 20, now BP normotensive. Will consider starting PO medication prn  -HA improved with compazine/benadryl this AM, now returned, will given another dose of compazine/benadryl  -On magnesium.  -UPC undetectable, CBC/CMP WNL. Will repeat in AM 3. Prematurity: BMZ 3/16, ordered for 3/17, at [redacted]w[redacted]d EFW 6lb3oz (66%) 4. COVID infection in pregnancy 5. Cerebral palsy: no deficits 6. Vaccines s/p tdap, covid, flu  Nicole Vance 02/19/21

## 2021-02-19 NOTE — Progress Notes (Signed)
Positive covid test on December 28th, 2021

## 2021-02-19 NOTE — Progress Notes (Signed)
.  OBGYN Note S: comfortable O: Vitals:   02/19/21 1101 02/19/21 1201 02/19/21 1301 02/19/21 1401  BP: 114/75 115/74 133/75 118/67  Pulse: 97 (!) 102 96 94  Resp: 16 18 17 18   Temp:   98.2 F (36.8 C)   TempSrc:   Oral   SpO2:      Weight:      Height:       SVE 1/50/-2, FB placed 60cc  FHR 130, Cat 1 Toco irritable   Recent Labs    02/18/21 2036 02/19/21 0857  WBC 7.0 6.9  HGB 12.3 12.4  HCT 34.0* 33.9*  PLT 200 202  NA 133* 135  K 3.7 3.9  CL 104 105  BUN 11 <5*  CREATININE 0.78 0.60  AST 19 22  ALT 17 17  BILITOT 0.4 0.5   Nicole Vance 35 y.o. G1P0 at [redacted]w[redacted]d IOL due to severe GHTN 1. IOL: SVE on admit 0/thick/high, s/p cytotec x3, now with FB. Plan to start pitocin 4h after cytotec #3 2. Severe GHTN: Severe range BP, HA improved with compazine/benadryl. on magnesium. Received IV lab and hydral protocol. Now normotensive-mild range. UPC undetectable, CBC/CMP WNL. Cont to monitor BP. Repeat CMP/CBC tonight 3. Prematurity: BMZ 3/16, at [redacted]w[redacted]d EFW 6lb3oz (66%) 4. COVID infection in pregnancy 5. Cerebral palsy: no deficits 6. Vaccines s/p tdap, covid, flu  Nicole Vance 02/19/21 2:54 PM

## 2021-02-19 NOTE — Progress Notes (Addendum)
CTSP  Pt had ha earlier but now resolved.    Vitals:   02/19/21 0155 02/19/21 0210 02/19/21 0226 02/19/21 0242  BP:  (!) 170/103 (!) 164/102 (!) 153/95  Pulse:  77 80 79  Resp:  18    Temp:  (!) 97.5 F (36.4 C)    TempSrc:  Oral    SpO2: 96% 100%    Weight:      Height:       FHTs 120s, gSTV, NST R, cat 1 Toco occ SVE C/Th/high  A/P CTSP for another series of severe range blood pressures.  Will move to L&D, start with cytotec, begin magnesium with pitocin.  Will need PCN in labor unless rapid strep is back and negative. If BPs stay in severe range, will continue hydralazine per protocol and start magnesium early.

## 2021-02-20 ENCOUNTER — Inpatient Hospital Stay (HOSPITAL_COMMUNITY): Payer: 59 | Admitting: Anesthesiology

## 2021-02-20 LAB — COMPREHENSIVE METABOLIC PANEL
ALT: 22 U/L (ref 0–44)
ALT: 21 U/L (ref 0–44)
AST: 24 U/L (ref 15–41)
AST: 23 U/L (ref 15–41)
Albumin: 3.2 g/dL — ABNORMAL LOW (ref 3.5–5.0)
Albumin: 2.8 g/dL — ABNORMAL LOW (ref 3.5–5.0)
Alkaline Phosphatase: 83 U/L (ref 38–126)
Alkaline Phosphatase: 79 U/L (ref 38–126)
Anion gap: 13 (ref 5–15)
Anion gap: 8 (ref 5–15)
BUN: <5 mg/dL — ABNORMAL LOW (ref 6–20)
BUN: 6 mg/dL (ref 6–20)
CO2: 18 mmol/L — ABNORMAL LOW (ref 22–32)
CO2: 20 mmol/L — ABNORMAL LOW (ref 22–32)
Calcium: 7.3 mg/dL — ABNORMAL LOW (ref 8.9–10.3)
Calcium: 6.6 mg/dL — ABNORMAL LOW (ref 8.9–10.3)
Chloride: 101 mmol/L (ref 98–111)
Chloride: 100 mmol/L (ref 98–111)
Creatinine, Ser: 0.7 mg/dL (ref 0.44–1.00)
Creatinine, Ser: 0.63 mg/dL (ref 0.44–1.00)
GFR, Estimated: >60 mL/min (ref >60)
GFR, Estimated: >60 mL/min (ref >60)
Glucose, Bld: 130 mg/dL — ABNORMAL HIGH (ref 70–99)
Glucose, Bld: 102 mg/dL — ABNORMAL HIGH (ref 70–99)
Potassium: 4 mmol/L (ref 3.5–5.1)
Potassium: 3.7 mmol/L (ref 3.5–5.1)
Sodium: 132 mmol/L — ABNORMAL LOW (ref 135–145)
Sodium: 128 mmol/L — ABNORMAL LOW (ref 135–145)
Total Bilirubin: 1 mg/dL (ref 0.3–1.2)
Total Bilirubin: 0.7 mg/dL (ref 0.3–1.2)
Total Protein: 6.3 g/dL — ABNORMAL LOW (ref 6.5–8.1)
Total Protein: 5.6 g/dL — ABNORMAL LOW (ref 6.5–8.1)

## 2021-02-20 LAB — CBC
HCT: 34.4 % — ABNORMAL LOW (ref 36.0–46.0)
Hemoglobin: 12.7 g/dL (ref 12.0–15.0)
MCH: 32.7 pg (ref 26.0–34.0)
MCHC: 36.9 g/dL — ABNORMAL HIGH (ref 30.0–36.0)
MCV: 88.7 fL (ref 80.0–100.0)
Platelets: 216 10*3/uL (ref 150–400)
RBC: 3.88 MIL/uL (ref 3.87–5.11)
RDW: 12.8 % (ref 11.5–15.5)
WBC: 12.1 10*3/uL — ABNORMAL HIGH (ref 4.0–10.5)
nRBC: 0 % (ref 0.0–0.2)

## 2021-02-20 LAB — MAGNESIUM: Magnesium: 5.3 mg/dL — ABNORMAL HIGH (ref 1.7–2.4)

## 2021-02-20 MED ORDER — DIPHENHYDRAMINE HCL 50 MG/ML IJ SOLN: 12.5000 mg | INTRAMUSCULAR | Status: DC | PRN

## 2021-02-20 MED ORDER — LACTATED RINGERS IV SOLN: 500.0000 mL | Freq: Once | INTRAVENOUS | Status: DC

## 2021-02-20 MED ORDER — PHENYLEPHRINE 40 MCG/ML (10ML) SYRINGE FOR IV PUSH (FOR BLOOD PRESSURE SUPPORT): 80.0000 ug | PREFILLED_SYRINGE | INTRAVENOUS | Status: DC | PRN

## 2021-02-20 MED ORDER — EPHEDRINE 5 MG/ML INJ: 10.0000 mg | INTRAVENOUS | Status: DC | PRN

## 2021-02-20 MED ORDER — LIDOCAINE HCL (PF) 1 % IJ SOLN
INTRAMUSCULAR | Status: DC | PRN
  Administered 2021-02-20: 4 mL via EPIDURAL
  Administered 2021-02-20: 6 mL via EPIDURAL

## 2021-02-20 MED ORDER — FENTANYL-BUPIVACAINE-NACL 0.5-0.125-0.9 MG/250ML-% EP SOLN
12.0000 mL/h | EPIDURAL | Status: DC | PRN
  Administered 2021-02-20: 12 mL/h via EPIDURAL
  Filled 2021-02-20 (×2): qty 250

## 2021-02-20 NOTE — Anesthesia Preprocedure Evaluation (Signed)
Anesthesia Evaluation  Patient identified by MRN, date of birth, ID band Patient awake    Reviewed: Allergy & Precautions, H&P , NPO status , Patient's Chart, lab work & pertinent test results  History of Anesthesia Complications Negative for: history of anesthetic complications  Airway Mallampati: II  TM Distance: >3 FB Neck ROM: full    Dental no notable dental hx.    Pulmonary asthma (exercise-induced) ,    Pulmonary exam normal        Cardiovascular hypertension (pre-eclampsia), Normal cardiovascular exam Rhythm:regular Rate:Normal     Neuro/Psych negative neurological ROS  negative psych ROS   GI/Hepatic negative GI ROS, Neg liver ROS,   Endo/Other  negative endocrine ROS  Renal/GU negative Renal ROS  negative genitourinary   Musculoskeletal   Abdominal   Peds  Hematology negative hematology ROS (+)   Anesthesia Other Findings   Reproductive/Obstetrics (+) Pregnancy                             Anesthesia Physical Anesthesia Plan  ASA: II  Anesthesia Plan: Epidural   Post-op Pain Management:    Induction:   PONV Risk Score and Plan:   Airway Management Planned:   Additional Equipment:   Intra-op Plan:   Post-operative Plan:   Informed Consent: I have reviewed the patients History and Physical, chart, labs and discussed the procedure including the risks, benefits and alternatives for the proposed anesthesia with the patient or authorized representative who has indicated his/her understanding and acceptance.       Plan Discussed with:   Anesthesia Plan Comments:         Anesthesia Quick Evaluation

## 2021-02-20 NOTE — Progress Notes (Signed)
Patient ID: Nicole Vance, female   DOB: Apr 14, 1986, 35 y.o.   MRN: [id number]  S: Still comfortable with contraction O AFVSS FHR 120s with accels, + scalp stim, decreased variabilty cvx 4/70/-2 toco nothing registering  IUPC placed  Pt on 24 mun of pitocin. External toco not registering contractions therefore IUPC placed. Suspect pt will require higher doses of pitocin due to preterm status. FWB reassuring

## 2021-02-20 NOTE — Progress Notes (Signed)
Patient ID: Nicole Vance, female   DOB: [birth year], 35 y.o.   MRN: [id number]  S: Comfortable after epidural O: AFVSS FHR 120s + accels, decreased variability, + scalp stim cvx 4/50/-3  AROM clear fluid FWB reassuring

## 2021-02-20 NOTE — Anesthesia Procedure Notes (Signed)
Epidural Patient location during procedure: OB Start time: 02/20/2021 8:20 AM End time: 02/20/2021 8:32 AM  Staffing Anesthesiologist: Lucretia Kern, MD Performed: anesthesiologist   Preanesthetic Checklist Completed: patient identified, IV checked, risks and benefits discussed, monitors and equipment checked, pre-op evaluation and timeout performed  Epidural Patient position: sitting Prep: DuraPrep Patient monitoring: heart rate, continuous pulse ox and blood pressure Approach: midline Location: L3-L4 Injection technique: LOR air  Needle:  Needle type: Tuohy  Needle gauge: 17 G Needle length: 9 cm Needle insertion depth: 5.5 cm Catheter type: closed end flexible Catheter size: 19 Gauge Catheter at skin depth: 10.5 cm Test dose: negative  Assessment Events: blood not aspirated, injection not painful, no injection resistance, no paresthesia and negative IV test  Additional Notes Reason for block:procedure for pain

## 2021-02-20 NOTE — Progress Notes (Addendum)
Patient ID: Nicole Vance, female   DOB: 11/26/1986, 35 y.o.   MRN: [id number]  S: Still comfortable O AFVSS FHR 120s decreased variability, + small accels, + scalp stim cvx 5-6/70/-2 toco Q 5, MVUs ~100-110  Pit currently at Will stop for 1 hr for wash out. Restart at 10 and increase FWB reassuring Last labs this am, will recheck cmp and mag level

## 2021-02-21 ENCOUNTER — Encounter (HOSPITAL_COMMUNITY): Payer: Self-pay | Admitting: Obstetrics and Gynecology

## 2021-02-21 LAB — CBC
HCT: 31.2 % — ABNORMAL LOW (ref 36.0–46.0)
Hemoglobin: 11.5 g/dL — ABNORMAL LOW (ref 12.0–15.0)
MCH: 32.6 pg (ref 26.0–34.0)
MCHC: 36.9 g/dL — ABNORMAL HIGH (ref 30.0–36.0)
MCV: 88.4 fL (ref 80.0–100.0)
Platelets: 179 10*3/uL (ref 150–400)
RBC: 3.53 MIL/uL — ABNORMAL LOW (ref 3.87–5.11)
RDW: 12.7 % (ref 11.5–15.5)
WBC: 12.7 10*3/uL — ABNORMAL HIGH (ref 4.0–10.5)
nRBC: 0 % (ref 0.0–0.2)

## 2021-02-21 MED ORDER — BENZOCAINE-MENTHOL 20-0.5 % EX AERO
1.0000 | INHALATION_SPRAY | CUTANEOUS | Status: DC | PRN
Start: 2021-02-21 — End: 2021-02-25
  Administered 2021-02-21: 1 via TOPICAL
  Filled 2021-02-21: qty 56

## 2021-02-21 MED ORDER — ZOLPIDEM TARTRATE 5 MG PO TABS
5.0000 mg | ORAL_TABLET | Freq: Every evening | ORAL | Status: DC | PRN
Start: 2021-02-21 — End: 2021-02-25

## 2021-02-21 MED ORDER — OXYCODONE HCL 5 MG PO TABS: 10.0000 mg | ORAL_TABLET | ORAL | Status: DC | PRN

## 2021-02-21 MED ORDER — DIBUCAINE (PERIANAL) 1 % EX OINT: 1.0000 "application " | TOPICAL_OINTMENT | CUTANEOUS | Status: DC | PRN

## 2021-02-21 MED ORDER — TRANEXAMIC ACID-NACL 1000-0.7 MG/100ML-% IV SOLN
INTRAVENOUS | Status: AC
  Filled 2021-02-21: qty 100

## 2021-02-21 MED ORDER — ACETAMINOPHEN 325 MG PO TABS
650.0000 mg | ORAL_TABLET | ORAL | Status: DC | PRN
Start: 2021-02-21 — End: 2021-02-21

## 2021-02-21 MED ORDER — TETANUS-DIPHTH-ACELL PERTUSSIS 5-2.5-18.5 LF-MCG/0.5 IM SUSY: 0.5000 mL | PREFILLED_SYRINGE | Freq: Once | INTRAMUSCULAR | Status: DC

## 2021-02-21 MED ORDER — WITCH HAZEL-GLYCERIN EX PADS: 1.0000 "application " | MEDICATED_PAD | CUTANEOUS | Status: DC | PRN

## 2021-02-21 MED ORDER — OXYCODONE HCL 5 MG PO TABS: 5.0000 mg | ORAL_TABLET | ORAL | Status: DC | PRN

## 2021-02-21 MED ORDER — ONDANSETRON HCL 4 MG PO TABS: 4.0000 mg | ORAL_TABLET | ORAL | Status: DC | PRN

## 2021-02-21 MED ORDER — COCONUT OIL OIL
1.0000 "application " | TOPICAL_OIL | Status: DC | PRN
  Administered 2021-02-21: 1 via TOPICAL

## 2021-02-21 MED ORDER — SIMETHICONE 80 MG PO CHEW: 80.0000 mg | CHEWABLE_TABLET | ORAL | Status: DC | PRN

## 2021-02-21 MED ORDER — IBUPROFEN 600 MG PO TABS
600.0000 mg | ORAL_TABLET | Freq: Four times a day (QID) | ORAL | Status: DC
  Administered 2021-02-21 – 2021-02-25 (×15): 600 mg via ORAL
  Filled 2021-02-21 (×16): qty 1

## 2021-02-21 MED ORDER — PRENATAL MULTIVITAMIN CH
1.0000 | ORAL_TABLET | Freq: Every day | ORAL | Status: DC
  Administered 2021-02-22 – 2021-02-25 (×4): 1 via ORAL
  Filled 2021-02-21 (×4): qty 1

## 2021-02-21 MED ORDER — DIPHENHYDRAMINE HCL 25 MG PO CAPS: 25.0000 mg | ORAL_CAPSULE | Freq: Four times a day (QID) | ORAL | Status: DC | PRN

## 2021-02-21 MED ORDER — SENNOSIDES-DOCUSATE SODIUM 8.6-50 MG PO TABS
2.0000 | ORAL_TABLET | Freq: Every day | ORAL | Status: DC
  Administered 2021-02-22 – 2021-02-25 (×3): 2 via ORAL
  Filled 2021-02-21 (×3): qty 2

## 2021-02-21 MED ORDER — ONDANSETRON HCL 4 MG/2ML IJ SOLN: 4.0000 mg | INTRAMUSCULAR | Status: DC | PRN

## 2021-02-21 NOTE — Lactation Note (Signed)
This note was copied from a baby's chart. Lactation Consultation Note  Patient Name: Nicole Vance Today's Date: 02/21/2021 Reason for consult: L&D Initial assessment;1st time breastfeeding;NICU baby Age:33 hours   Infant [redacted]w[redacted]d transferred to NICU.  Briefly reviewed hand expression. Informed mother that lactation will follow up with mother to initiate pumping.   Maternal Data Has patient been taught Hand Expression?: Yes Does the patient have breastfeeding experience prior to this delivery?: No  Interventions Interventions: Education    Consult Status Consult Status: Follow-up Date: 02/21/21 Follow-up type: In-patient    Dahlia Byes [facility] 02/21/2021, 12:10 PM

## 2021-02-21 NOTE — Progress Notes (Signed)
Pt's recovery is complete. VSS and bleeding WNL. NICU called; NICU would like about 10 minutes to place IV before bringing [name] up to see him. OBSC called and informed of plan of care.

## 2021-02-21 NOTE — Anesthesia Postprocedure Evaluation (Signed)
Anesthesia Post Note  Patient: Nicole Vance  Procedure(s) Performed: AN AD HOC LABOR EPIDURAL     Patient location during evaluation: Mother Baby Anesthesia Type: Epidural Level of consciousness: awake and alert, oriented and patient cooperative Pain management: pain level controlled Vital Signs Assessment: post-procedure vital signs reviewed and stable Respiratory status: spontaneous breathing Cardiovascular status: stable Postop Assessment: no headache, epidural receding, patient able to bend at knees and no signs of nausea or vomiting Anesthetic complications: no Comments: Pt. States she is walking. Pain score 4. Pt. Reports pain is manageable.    No complications documented.  Last Vitals:  Vitals:   02/21/21 1250 02/21/21 1357  BP: (!) 153/92 (!) 151/75  Pulse: 92 89  Resp: 20 20  Temp:    SpO2: 98% 98%    Last Pain:  Vitals:   02/21/21 1359  TempSrc:   PainSc: 2    Pain Goal:                   [facility]

## 2021-02-21 NOTE — Lactation Note (Signed)
This note was copied from a baby's chart. Lactation Consultation Note  Patient Name: Nicole Vance EUMPN'T Date: 02/21/2021 Reason for consult: Follow-up assessment;1st time breastfeeding;Primapara;NICU baby;Infant < 6lbs;Late-preterm 34-36.6wks Age:33 hours  Visited with mom of 8 hours old LPI female < 6 lbs, she's a P1 and reported (+) breast changes during the pregnancy. Baby is currently in the NICU he was in Similac 24 calorie formula but it was recently switched to donor milk. RN has already set mom up with a DEBP, but she hasn't started pumping yet.    LC assisted mom with hand expression prior pumping, but unable to get droplets of colostrum from either breasts at this point. Noticed that she has semi-flat/very short shafted nipples and large breasts; tissue is semi-compressible with some areolar edema.   Mom started pumping during [facility] consultation, praised her for her efforts. She understands that pumping this early on is mainly for breast stimulation and not to get volume. RN Thayer Ohm brought coconut oil in the room already, LC advised mom to use it prior pumping until her milk/colostrum is in.   Reviewed pumping schedule, benefits of breastmilk for NICU babies, lactogenesis II and breastmilk storage guidelines. Mom is currently on Mag but she should be taken off it tomorrow morning.  Feeding plan:  1. Encouraged mom to pump every 2-3 hours; ideally 8 times/24 hours 2. Hand expression and breast massage were also encouraged prior pumping 3. NICU RN will provide breastmilk labels tomorrow and OB specialty care RN the colostrum containers for breastmilk storage  BF brochure, BF resources and NICU booklet were reviewed. No support person in mom's room at the time of [facility] consultation. Mom reported all questions and concerns were answered, she's aware of LC OP services and will call PRN.   Maternal Data Has patient been taught Hand Expression?: Yes Does the patient have breastfeeding  experience prior to this delivery?: No  Feeding Mother's Current Feeding Choice: Breast Milk  Lactation Tools Discussed/Used Tools: Pump;Flanges Flange Size: 24 Breast pump type: Double-Electric Breast Pump Pump Education: Setup, frequency, and cleaning;Milk Storage Reason for Pumping: LPI < 6 lbs in NICU Pumping frequency: q 3 hours Pumped volume: 0 mL  Interventions Interventions: Breast feeding basics reviewed;DEBP;Breast massage;Hand express;Breast compression  Discharge Pump: DEBP;Personal (Medela DEBP at home) [facility] Program: No  Consult Status Consult Status: Follow-up Date: 02/22/21 Follow-up type: In-patient    Nicole Vance 02/21/2021, 7:07 PM

## 2021-02-22 LAB — CBC
HCT: 29.4 % — ABNORMAL LOW (ref 36.0–46.0)
Hemoglobin: 10.5 g/dL — ABNORMAL LOW (ref 12.0–15.0)
MCH: 32.2 pg (ref 26.0–34.0)
MCHC: 35.7 g/dL (ref 30.0–36.0)
MCV: 90.2 fL (ref 80.0–100.0)
Platelets: 162 10*3/uL (ref 150–400)
RBC: 3.26 MIL/uL — ABNORMAL LOW (ref 3.87–5.11)
RDW: 13 % (ref 11.5–15.5)
WBC: 8.2 10*3/uL (ref 4.0–10.5)
nRBC: 0 % (ref 0.0–0.2)

## 2021-02-22 LAB — COMPREHENSIVE METABOLIC PANEL
ALT: 21 U/L (ref 0–44)
AST: 36 U/L (ref 15–41)
Albumin: 2.5 g/dL — ABNORMAL LOW (ref 3.5–5.0)
Alkaline Phosphatase: 64 U/L (ref 38–126)
Anion gap: 7 (ref 5–15)
BUN: 9 mg/dL (ref 6–20)
CO2: 26 mmol/L (ref 22–32)
Calcium: 6.7 mg/dL — ABNORMAL LOW (ref 8.9–10.3)
Chloride: 104 mmol/L (ref 98–111)
Creatinine, Ser: 0.64 mg/dL (ref 0.44–1.00)
GFR, Estimated: >60 mL/min (ref >60)
Glucose, Bld: 83 mg/dL (ref 70–99)
Potassium: 4.1 mmol/L (ref 3.5–5.1)
Sodium: 137 mmol/L (ref 135–145)
Total Bilirubin: 0.5 mg/dL (ref 0.3–1.2)
Total Protein: 5.2 g/dL — ABNORMAL LOW (ref 6.5–8.1)

## 2021-02-22 NOTE — Plan of Care (Signed)
  Problem: Pain Managment: Goal: General experience of comfort will improve Outcome: Progressing   Problem: Education: Goal: Knowledge of disease or condition will improve Outcome: Progressing Goal: Knowledge of the prescribed therapeutic regimen will improve Outcome: Progressing   Problem: Clinical Measurements: Goal: Complications related to disease process, condition or treatment will be avoided or minimized Outcome: Progressing   Problem: Clinical Measurements: Goal: Complications related to disease process, condition or treatment will be avoided or minimized Outcome: Progressing   Problem: Education: Goal: Knowledge of condition will improve Outcome: Progressing   Problem: Activity: Goal: Will verbalize the importance of balancing activity with adequate rest periods Outcome: Progressing Goal: Ability to tolerate increased activity will improve Outcome: Progressing   Problem: Life Cycle: Goal: Chance of risk for complications during the postpartum period will decrease Outcome: Progressing

## 2021-02-22 NOTE — Progress Notes (Signed)
Patient is eating, ambulating, voiding.  Pain control is good.  Doing well, appropriate lochia, no complaints.  Vitals:   02/22/21 0619 02/22/21 0700 02/22/21 0808 02/22/21 0930  BP:   (!) 154/94   Pulse:   73   Resp: 16 17 18 18   Temp:   98.5 F (36.9 C)   TempSrc:   Oral   SpO2:   100%   Weight:      Height:        Fundus firm Ext: no calf tenderness  Lab Results  Component Value Date   WBC 8.2 02/22/2021   HGB 10.5 (L) 02/22/2021   HCT 29.4 (L) 02/22/2021   MCV 90.2 02/22/2021   PLT 162 02/22/2021    --/--/O POS (03/15 2125)  A/P Post partum day 1 Baby Harry in NICU, now of CPAP, doing well per mom, just working on feeding High mild range BPs, will monitor and consider adding Procardia XL 30mg  if persistently in this range.  PEC labs wnl Desires circ when baby is ready, reviewed elective nature, risks, potential complications.  Pt will sign consent.  Routine care.    2126

## 2021-02-22 NOTE — Lactation Note (Signed)
This note was copied from a baby's chart. Lactation Consultation Note  Patient Name: Nicole Vance NOMVE'H Date: 02/22/2021 Reason for consult: Follow-up assessment Age:33 hours  1354 - 1435 - I followed up with Ms. Lovins and her 44 hour old son, Nicole Vance, in the NICU. I assisted with her first breast feeding session. Ms. Weatherholtz's husband was present, and he was involved in the consult.   I assisted with latching baby Nicole Vance to the left breast in cross cradle hold. I provided some tips for adjusting her position while feeding baby. Baby latched to the breast, and I held a compression to note some suckling sequences. He would pause and release the breast. Ms. Spinola could feel the tug, and I helped her identify the difference between sucking and swallowing, and breathing.  After a few attempts, I placed a size 20 nipple shield onto the breast. Baby latched to the shield and was able to maintain his grasp even through several rest breaks. After several minutes of breast feeding, he became still and held the nipple in his mouth and rested. We eventually moved him STS while he was being gavage fed.  I reviewed pumping practices with Ms. Seder and her husband. I recommended that she contact her insurance company on Monday to either order a personal pump or to see if they cover a pump rental. We discussed the benefits of a Symphony pump. I encouraged her to check in with lactation tomorrow regarding her need for a pump. She has a personal pump at home that she was gifted, and she can use this as a backup pump. We discussed that this pump may not have the recommended suction to adequately set and maintain a strong milk volume if used in the long term.  Ms. Siegfried verbalized understanding. She would like lactation to follow up tomorrow.  Feeding Mother's Current Feeding Choice: Breast Milk and Donor Milk Nipple Type: Nfant Extra Slow Flow (gold)  LATCH Score Latch: Grasps breast easily, tongue down, lips  flanged, rhythmical sucking.  Audible Swallowing: None  Type of Nipple: Everted at rest and after stimulation  Comfort (Breast/Nipple): Soft / non-tender  Hold (Positioning): Assistance needed to correctly position infant at breast and maintain latch.  LATCH Score: 7   Lactation Tools Discussed/Used    Interventions Interventions: Breast feeding basics reviewed;Assisted with latch;Skin to skin;Hand express;Breast compression;Adjust position;Support pillows;DEBP;Education  Discharge Pump: DEBP  Consult Status Consult Status: Follow-up Date: 02/23/21 Follow-up type: In-patient    Walker Shadow 02/22/2021, 2:40 PM

## 2021-02-22 NOTE — Lactation Note (Signed)
This note was copied from a baby's chart. Lactation Consultation Note  Patient Name: Nicole Vance HGDJM'E Date: 02/22/2021 Reason for consult: Follow-up assessment;Primapara;1st time breastfeeding;NICU baby;Late-preterm 34-36.6wks Age:33 hours   0957 - 1010 - I followed up with [name]. She is due to discontinue mag at 1000 today. She is looking forward to seeing [name] today. We reviewed pumping practices. She is currently pumping droplets. I set a follow up appointment in the NICU for the 1400 feeding today.   Feeding Mother's Current Feeding Choice: Breast Milk and Donor Milk   Interventions Interventions: Education;DEBP  Discharge Pump: DEBP  Consult Status Consult Status: Follow-up Date: 02/22/21 Follow-up type: In-patient    Walker Shadow 02/22/2021, 10:17 AM

## 2021-02-23 LAB — CBC WITH DIFFERENTIAL/PLATELET
Abs Immature Granulocytes: 0.03 10*3/uL (ref 0.00–0.07)
Basophils Absolute: 0 10*3/uL (ref 0.0–0.1)
Basophils Relative: 0 %
Eosinophils Absolute: 0.1 10*3/uL (ref 0.0–0.5)
Eosinophils Relative: 1 %
HCT: 28.8 % — ABNORMAL LOW (ref 36.0–46.0)
Hemoglobin: 10.2 g/dL — ABNORMAL LOW (ref 12.0–15.0)
Immature Granulocytes: 0 %
Lymphocytes Relative: 21 %
Lymphs Abs: 1.7 10*3/uL (ref 0.7–4.0)
MCH: 32.3 pg (ref 26.0–34.0)
MCHC: 35.4 g/dL (ref 30.0–36.0)
MCV: 91.1 fL (ref 80.0–100.0)
Monocytes Absolute: 0.5 10*3/uL (ref 0.1–1.0)
Monocytes Relative: 6 %
Neutro Abs: 5.6 10*3/uL (ref 1.7–7.7)
Neutrophils Relative %: 72 %
Platelets: 176 10*3/uL (ref 150–400)
RBC: 3.16 MIL/uL — ABNORMAL LOW (ref 3.87–5.11)
RDW: 12.6 % (ref 11.5–15.5)
WBC: 7.9 10*3/uL (ref 4.0–10.5)
nRBC: 0 % (ref 0.0–0.2)

## 2021-02-23 LAB — COMPREHENSIVE METABOLIC PANEL
ALT: 20 U/L (ref 0–44)
AST: 22 U/L (ref 15–41)
Albumin: 2.6 g/dL — ABNORMAL LOW (ref 3.5–5.0)
Alkaline Phosphatase: 54 U/L (ref 38–126)
Anion gap: 10 (ref 5–15)
BUN: 12 mg/dL (ref 6–20)
CO2: 25 mmol/L (ref 22–32)
Calcium: 8.5 mg/dL — ABNORMAL LOW (ref 8.9–10.3)
Chloride: 100 mmol/L (ref 98–111)
Creatinine, Ser: 0.61 mg/dL (ref 0.44–1.00)
GFR, Estimated: >60 mL/min (ref >60)
Glucose, Bld: 78 mg/dL (ref 70–99)
Potassium: 3.9 mmol/L (ref 3.5–5.1)
Sodium: 135 mmol/L (ref 135–145)
Total Bilirubin: 0.5 mg/dL (ref 0.3–1.2)
Total Protein: 5.3 g/dL — ABNORMAL LOW (ref 6.5–8.1)

## 2021-02-23 MED ORDER — NIFEDIPINE ER OSMOTIC RELEASE 30 MG PO TB24
30.0000 mg | ORAL_TABLET | Freq: Every day | ORAL | Status: DC
  Administered 2021-02-23: 30 mg via ORAL
  Filled 2021-02-23: qty 1

## 2021-02-23 MED ORDER — NIFEDIPINE ER OSMOTIC RELEASE 30 MG PO TB24
60.0000 mg | ORAL_TABLET | Freq: Every day | ORAL | Status: DC
  Administered 2021-02-24: 60 mg via ORAL
  Filled 2021-02-23: qty 2

## 2021-02-23 MED ORDER — NIFEDIPINE ER OSMOTIC RELEASE 30 MG PO TB24
30.0000 mg | ORAL_TABLET | Freq: Once | ORAL | Status: AC
  Administered 2021-02-23: 30 mg via ORAL
  Filled 2021-02-23: qty 1

## 2021-02-23 MED ORDER — NIFEDIPINE ER OSMOTIC RELEASE 30 MG PO TB24: 30.0000 mg | ORAL_TABLET | Freq: Every day | ORAL | Status: DC

## 2021-02-23 NOTE — Lactation Note (Signed)
This note was copied from a baby's chart. Lactation Consultation Note  Patient Name: Nicole Vance ZOXWR'U Date: 02/23/2021 Reason for consult: Follow-up assessment;NICU baby;Late-preterm 34-36.6wks;Hyperbilirubinemia Age:33 hours   Mom and dad visiting with OB when LC entered.  Mom was tearful.  Mom's BP elevated and was just given labetalol.    Mom and dad had several questions regarding pumping.  Infant is on phototherapy and STS will be limited to 30 minute in the next 24 hours per dad.  Mom was praised for pumping efforts.  She was encouraged to focus on pumping today, every 2-3 hours for a goal of at least 8 times in 24 hours.  LC encouraged hand expressing after pumping and taking EBM to NICU.    Questions were answered regarding personal pump and if this would be adequate for pumping at home during the night when away from baby,  once mom was DC'd.  Mom is unsure if her personal pump is a battery operated or electric.  Parents will check today.  Mom plans to use hospital grade pump when visiting and will try to get a rental, (available 3/28).  If unable to get the rental, she will use her personal at home during the night.    Boxes drawn on the board to encourage reaching pumping goal for the next 24 hours.         Maternal Data    Feeding Mother's Current Feeding Choice: Breast Milk and Donor Milk  LATCH Score                    Lactation Tools Discussed/Used Tools: Pump Breast pump type: Double-Electric Breast Pump Pump Education: Setup, frequency, and cleaning Reason for Pumping: NICU Pumping frequency: Mom is pumping every 3 hours.  recently pumped at midnight, 4 am, and 7 am Pumped volume:  (droplets)  Interventions    Discharge Pump: DEBP  Consult Status Consult Status: Follow-up Date: 02/24/21 Follow-up type: In-patient    Maryruth Hancock [facility] 02/23/2021, 10:09 AM

## 2021-02-23 NOTE — Lactation Note (Signed)
This note was copied from a baby's chart. Lactation Consultation Note  Patient Name: Nicole Vance EHMCN'O Date: 02/23/2021 Reason for consult: Follow-up assessment;NICU baby;Late-preterm 34-36.6wks;Hyperbilirubinemia Age:33 hours   Dad returned from gift shop and has a rental.  LC encouraged dad to let lactation/ nursing staff know if they had any questions regarding the pump. Maternal Data    Feeding Mother's Current Feeding Choice: Breast Milk and Donor Milk  LATCH Score                    Lactation Tools Discussed/Used Tools: Pump Breast pump type: Double-Electric Breast Pump Pump Education: Setup, frequency, and cleaning Reason for Pumping: NICU Pumping frequency: Mom is pumping every 3 hours.  recently pumped at midnight, 4 am, and 7 am Pumped volume:  (droplets)  Interventions    Discharge Pump: DEBP  Consult Status Consult Status: Follow-up Date: 02/24/21 Follow-up type: In-patient    Maryruth Hancock [facility] 02/23/2021, 10:18 AM

## 2021-02-23 NOTE — Progress Notes (Signed)
Patient is eating, ambulating, voiding.  Pain control is good.  Pt is tearful due to recent elevated BPs requiring IV medication.  Appropriate lochia, no CP/SOB, vision changes/ abd pain. No calf tenderness. Baby is doing well in NICU.  Vitals:   02/23/21 0921 02/23/21 0931 02/23/21 0941 02/23/21 0951  BP: (!) 161/89 (!) 163/103 (!) 155/99 (!) 157/93  Pulse: 65 71 76 77  Resp:      Temp:      TempSrc:      SpO2:      Weight:      Height:        Fundus firm Ext: no calf tenderness  Lab Results  Component Value Date   WBC 7.9 02/23/2021   HGB 10.2 (L) 02/23/2021   HCT 28.8 (L) 02/23/2021   MCV 91.1 02/23/2021   PLT 176 02/23/2021    --/--/O POS (03/15 2125)  A/P Post partum day 2 from IOL for severe PEC Baby still in NICU with feeding issues, not on CPAP and overall doing well per parents Severe range BPs this am requiring IV labetalol protocol 20/40/80.  Procardia XL 30mg  daily ordered. Is s/p MgSO4 yesterday.  Labs this am: ALT/AST/PLT wnl. Cr normal. Discussed management and expectation of course of severe PEC.  Will keep patient for BP monitoring and medication adjustment as need.  Routine care.    

## 2021-02-24 MED ORDER — NIFEDIPINE ER OSMOTIC RELEASE 30 MG PO TB24
90.0000 mg | ORAL_TABLET | Freq: Every day | ORAL | Status: DC
  Administered 2021-02-25: 90 mg via ORAL
  Filled 2021-02-24: qty 3

## 2021-02-24 MED ORDER — NIFEDIPINE ER OSMOTIC RELEASE 30 MG PO TB24
30.0000 mg | ORAL_TABLET | Freq: Once | ORAL | Status: AC
  Administered 2021-02-24: 30 mg via ORAL
  Filled 2021-02-24: qty 1

## 2021-02-24 NOTE — Progress Notes (Signed)
OBGYN Note S: feeling fine, denies HA/VC/RUQ pain/SOB O:  Vitals:   02/24/21 1432 02/24/21 1629 02/24/21 1631 02/24/21 1642  BP: (!) 141/87 (!) 160/100 (!) 152/97 (!) 156/88  Pulse: 94     Resp:      Temp:      TempSrc:      SpO2:      Weight:      Height:       Nicole Vance 34 y.o. G1P0101 PPD#3 sp SVD at [redacted]w[redacted]d for severe preeclampsia, recommend continued inpatient monitoring for BP management 1. Severe preeclampsia: -Required IV antihypertensives PPD#2, now on porcardia 30mg  xL, increased to 60mg  xL this AM, due to continued elevated BP added 30mg  procardia and written for 90mg  xL AM tomorrow. -Labs stable and wnl yesterday -Asympmtomatic  Nicole Vance 02/24/21 7:14 PM

## 2021-02-24 NOTE — Progress Notes (Signed)
Post Partum Day 3 Subjective: no complaints, up ad lib, voiding, tolerating PO and denies HA/VC/RUQ pain/SOB  Objective: Patient Vitals for the past 24 hrs:  BP Temp Temp src Pulse Resp SpO2  02/24/21 0945 (!) 152/98 -- -- (!) 101 -- --  02/24/21 0745 137/85 97.9 F (36.6 C) Oral 89 16 99 %  02/24/21 0356 (!) 146/88 97.9 F (36.6 C) Oral 73 16 100 %  02/23/21 2350 -- -- -- -- -- 98 %  02/23/21 2348 138/87 98.1 F (36.7 C) Oral 93 16 99 %  02/23/21 1914 (!) 149/93 98 F (36.7 C) Oral 67 18 100 %  02/23/21 1622 (!) 153/98 98.1 F (36.7 C) Oral 75 19 100 %  02/23/21 1302 (!) 159/88 97.8 F (36.6 C) Oral 73 20 100 %  02/23/21 1301 (!) 160/98 -- -- 76 -- --    Physical Exam:  General: alert Lochia: appropriate Uterine Fundus: firm DVT Evaluation: No evidence of DVT seen on physical exam.  Recent Labs    02/22/21 0412 02/23/21 0843  WBC 8.2 7.9  HGB 10.5* 10.2*  HCT 29.4* 28.8*  PLT 162 176    Recent Labs    02/22/21 0412 02/23/21 0843  NA 137 135  K 4.1 3.9  CL 104 100  BUN 9 12  CREATININE 0.64 0.61  GLUCOSE 83 78  BILITOT 0.5 0.5  ALT 21 20  AST 36 22  ALKPHOS 64 54  PROT 5.2* 5.3*  ALBUMIN 2.5* 2.6*    Recent Labs    02/22/21 0412 02/23/21 0843  CALCIUM 6.7* 8.5*   Assessment/Plan: Nicole Vance 34 y.o. G1P0101 PPD#3 sp SVD at [redacted]w[redacted]d for severe preeclampsia 1. PPC: EBL 200cc, R mediolateral episiotomy repaired.  2. Severe preeclampsia: -Required IV antihypertensives PPD#2, now on porcardia 30mg  xL, increased to 60mg  xL this AM. Monitor BP today, may need to increase to 90mg , if well controlled will discharge today otherwise continue inpatient monitoring of BP -Labs stable and wnl -Asympmtomatic -s/p 24h pp magnesium 3. COVID infection in pregnancy 4. Cerebral palsy: no deficits 5. S/p tdap, covid, flu vaccines Rubella immune, blood type O POS, breastfeeding, baby boy in NICU - desires circ when able to have circ   LOS: 6 days   Shail Urbas K  Taam-Akelman 02/24/2021, 11:47 AM

## 2021-02-24 NOTE — Lactation Note (Signed)
This note was copied from a baby's chart. Lactation Consultation Note  Patient Name: Nicole Vance [id number] Date: 02/24/2021 Reason for consult: Follow-up assessment Age:33 hours   1210 - 1300 - I followed up with [name] today by appointment. She requested latch assistance. SLP, Anise Salvo, entered the room for a portion of this visit.  We placed baby in football hold on the right breast with a size 20 NS. Baby latched to the shield and held the nipple in her mouth and rested. We noted occasional suckling bursts. Baby picked up on suckling activity towards the end of the feeding. Baby gavage fed around 2/3 of the way into the time at the breast.  Education provided on IDF protocol, baby's maturity/developmental ability to sustain suckling sequences at the breast, etc.  After the feeding, we reviewed [name]'s pump settings. We noted that a size 24 flange appeared to be appropriate for her left breast, and a size 21 flange appeared to be appropriate for her right breast. [name] is now pumping about 5 mls (combined). I provided praise and encouragement, and recommended that she continue to pump both breasts for 15 minutes. We discussed pumping bras, etc.  I recommend that lactation follow up this week to support [name] in her efforts to latch baby and to establish her milk supply.  Maternal Data Has patient been taught Hand Expression?: Yes Does the patient have breastfeeding experience prior to this delivery?: No  Feeding Mother's Current Feeding Choice: Breast Milk and Donor Milk  LATCH Score Latch: Repeated attempts needed to sustain latch, nipple held in mouth throughout feeding, stimulation needed to elicit sucking reflex.  Audible Swallowing: A few with stimulation  Type of Nipple: Everted at rest and after stimulation  Comfort (Breast/Nipple): Soft / non-tender  Hold (Positioning): Assistance needed to correctly position infant at breast and maintain latch.  LATCH  Score: 7   Lactation Tools Discussed/Used Tools: Nipple Shields Nipple shield size: 20 Flange Size: 21;24 Breast pump type: Double-Electric Breast Pump Pump Education: Setup, frequency, and cleaning Reason for Pumping: stimulation; supplementation Pumping frequency: 8 Pumped volume: 5 mL  Interventions Interventions: Breast feeding basics reviewed;Assisted with latch;Skin to skin;Hand express;Breast compression;Adjust position;Support pillows;Education  Discharge Pump: DEBP;Rented;Advised to call insurance company  Consult Status Consult Status: Follow-up Date: 02/25/21 Follow-up type: In-patient    Walker Shadow 02/24/2021, 1:28 PM

## 2021-02-24 NOTE — Progress Notes (Signed)
Patient screened out for psychosocial assessment since none of the following apply:  Psychosocial stressors documented in mother or baby's chart  Gestation less than 32 weeks  Code at delivery   Infant with anomalies Please contact the Clinical Social Worker if specific needs arise, by [name]'s request, or if [name] scores greater than 9/yes to question 10 on Edinburgh Postpartum Depression Screen.  Fahim Kats, LCSW Clinical Social Worker [facility] Cell#: [phone]     

## 2021-02-25 ENCOUNTER — Encounter: Payer: Self-pay | Admitting: Obstetrics and Gynecology

## 2021-02-25 MED ORDER — NIFEDIPINE ER OSMOTIC RELEASE 90 MG PO TB24: 90.0000 mg | ORAL_TABLET | Freq: Every day | ORAL | 1 refills | Status: DC

## 2021-02-25 MED ORDER — IBUPROFEN 600 MG PO TABS: 600.0000 mg | ORAL_TABLET | Freq: Four times a day (QID) | ORAL | 0 refills | Status: DC | PRN

## 2021-02-25 NOTE — Progress Notes (Signed)
CSW received consult due to score 13 on Edinburgh Depression Screen.    CSW met with [name] and [name] in room 324. When CSW arrived, [name] was engaging in skin to skin with infant and [name] was observing their interactions.  Everyone appeared happy and comfortable. CSW explained CSW's role and [name] gave CSW permission to meet with [name] while [name] was present. The couple appeared supportive of one another and was easy to engage.  [name] was polite and receptive to CSW interventions.   CSW reviewed [name]'s Edinburgh results and [name] acknowledged feeling overwhelmed and frustrated "The past few days, but today I feel so much better." CSW provided education regarding Baby Blues vs PMADs and provided [name] with resources for mental health follow up.  CSW encouraged [name] to evaluate her mental health throughout the postpartum period with the use of the New Mom Checklist developed by Postpartum Progress as well as the Edinburgh Postnatal Depression Scale and notify a medical professional if symptoms arise.  [name] did not present with any acute MH symptoms and she denied having a MH hx. CSW assessed for safety and [name] denied SI and HI. [name] reports having a good support team and they feel prepared for infant's discharge.   No additional needs or supports where identified. CSW is signing off at parent's request.  Nicole Vance, MSW, LCSW Clinical Social Work [phone] 

## 2021-02-25 NOTE — Progress Notes (Signed)
D/c  Teaching  Complete   Ambulated out with   husband

## 2021-02-25 NOTE — Discharge Summary (Signed)
Postpartum Discharge Summary     Patient Name: Nicole Vance DOB: 1986-01-14 MRN: [id number]  Date of admission: 02/18/2021 Delivery date:02/21/2021  Delivering provider: Philip Aspen  Date of discharge: 02/25/2021  Admitting diagnosis: Preeclampsia, severe, third trimester [O14.13] Pregnancy [Z34.90] Intrauterine pregnancy: [redacted]w[redacted]d     Secondary diagnosis:  Active Problems:   Preeclampsia, severe, third trimester   Pregnancy  Additional problems: Indicated Preterm Delivery    Discharge diagnosis: Preterm Pregnancy Delivered, Severe Pre-eclampsia                                           Post partum procedures:NA Augmentation: AROM and Pitocin Complications: None  Hospital course: Induction of Labor With Vaginal Delivery   35 y.o. yo G1P0101 at [redacted]w[redacted]d was admitted to the hospital 02/18/2021 for induction of labor.  Indication for induction: Severe Pre-eclampsia.  Patient had an uncomplicated labor course as follows: Membrane Rupture Time/Date: 9:48 AM ,02/20/2021   Delivery Method:Vaginal, Spontaneous  Episiotomy: Right Mediolateral  Lacerations:  Labial  Details of delivery can be found in separate delivery note.  Patient had a routine postpartum course. Patient is discharged home 02/25/21.  Newborn Data: Birth date:02/21/2021  Birth time:10:11 AM  Gender:Female  Living status:Living  Apgars:6 ,8  Weight:2660 g   Magnesium Sulfate received: Yes: Seizure prophylaxis BMZ received: Yes Rhophylac:No   Physical exam  Vitals:   02/25/21 0410 02/25/21 0710 02/25/21 0748 02/25/21 0749  BP: (!) 134/94 (!) 142/94  (!) 142/96  Pulse: 81 86  86  Resp: 17 18 18    Temp: 98 F (36.7 C) 98.1 F (36.7 C) 97.8 F (36.6 C)   TempSrc: Oral Oral Oral   SpO2: 98% 100% 100%   Weight:      Height:       General: alert, cooperative and no distress Lochia: appropriate Uterine Fundus: firm Incision: Healing well with no significant drainage DVT Evaluation: No evidence of DVT seen  on physical exam. Labs: Lab Results  Component Value Date   WBC 7.9 02/23/2021   HGB 10.2 (L) 02/23/2021   HCT 28.8 (L) 02/23/2021   MCV 91.1 02/23/2021   PLT 176 02/23/2021   CMP Latest Ref Rng & Units 02/23/2021  Glucose 70 - 99 mg/dL 78  BUN 6 - 20 mg/dL 12  Creatinine 02/25/2021 - 6.65 mg/dL 9.93  Sodium 5.70 - 177 mmol/L 135  Potassium 3.5 - 5.1 mmol/L 3.9  Chloride 98 - 111 mmol/L 100  CO2 22 - 32 mmol/L 25  Calcium 8.9 - 10.3 mg/dL 939)  Total Protein 6.5 - 8.1 g/dL 5.3(L)  Total Bilirubin 0.3 - 1.2 mg/dL 0.5  Alkaline Phos 38 - 126 U/L 54  AST 15 - 41 U/L 22  ALT 0 - 44 U/L 20   Edinburgh Score: Edinburgh Postnatal Depression Scale Screening Tool 02/24/2021  I have been able to laugh and see the funny side of things. 0  I have looked forward with enjoyment to things. 0  I have blamed myself unnecessarily when things went wrong. 2  I have been anxious or worried for no good reason. 2  I have felt scared or panicky for no good reason. 1  Things have been getting on top of me. 2  I have been so unhappy that I have had difficulty sleeping. 2  I have felt sad or miserable. 2  I have been  so unhappy that I have been crying. 2  The thought of harming myself has occurred to me. 0  Edinburgh Postnatal Depression Scale Total 13      After visit meds:  Allergies as of 02/25/2021      Reactions   Cashew Nut Oil Hives, Swelling   Allergy to tree nuts      Medication List    STOP taking these medications   aspirin EC 81 MG tablet   calcium carbonate 500 MG chewable tablet Commonly known as: TUMS - dosed in mg elemental calcium   diphenhydramine-acetaminophen 25-500 MG Tabs tablet Commonly known as: TYLENOL PM     TAKE these medications   albuterol 108 (90 Base) MCG/ACT inhaler Commonly known as: VENTOLIN HFA Inhale 1 puff into the lungs every 6 (six) hours as needed for wheezing or shortness of breath.   ibuprofen 600 MG tablet Commonly known as: ADVIL Take 1  tablet (600 mg total) by mouth every 6 (six) hours as needed.   NIFEdipine 90 MG 24 hr tablet Commonly known as: PROCARDIA XL/NIFEDICAL-XL Take 1 tablet (90 mg total) by mouth daily. Start taking on: February 26, 2021   prenatal multivitamin Tabs tablet Take 1 tablet by mouth daily at 12 noon.        Discharge home in stable condition Infant Feeding: Breast Infant Disposition:NICU Discharge instruction: per After Visit Summary and Postpartum booklet. Activity: Advance as tolerated. Pelvic rest for 6 weeks.  Diet: routine diet Anticipated Birth Control: Unsure Postpartum Appointment:2-3 days Future Appointments:No future appointments. Follow up Visit:  Follow-up Information    Philip Aspen, DO Follow up on 02/28/2021.   Specialty: Obstetrics and Gynecology Why: Follow-up for a Blood Pressure check Friday 02/28/2021 @300  pm Contact information: [address] [place] [phone]                   02/25/2021 02/27/2021, MD

## 2021-02-27 ENCOUNTER — Ambulatory Visit: Payer: Self-pay

## 2021-02-27 NOTE — Lactation Note (Signed)
This note was copied from a baby's chart. Lactation Consultation Note  Patient Name: Nicole Vance EAVWU'J Date: 02/27/2021 Reason for consult: NICU baby;Follow-up assessment;Late-preterm 34-36.6wks Age:33 days  LC to room for lactation consultation. Assisted with latch and observed bf. Afterwards, provided hands-free pumping top and observed pumping. Provided guidance on hands-on pumping and frequency needed for milk production. Baby latches well. His suck pattern is trending towards normal but without audible swallows today. He bf for 7 minutes. +milk in nipple shield. Can likely progress IDF when audible swallows are present. Will plan f/u visit tomorrow at 11:00.   Maternal Data  Mom's reported pumping volume was low yesterday ( x 7 pumping sessions = ). LC observed increased volume during today's pumping session (>67mLs). It is likely that mom's hx and treatment of hypertension combined with infrequent pumping sessions are related to low milk volume on day 6pp. Mom's milk volume will likely increase quickly to normal volume with hands-on pumping 8-10 x day. As baby becomes an effective breastfeeder she should be able to discontinue the frequent pumpings.   LC suggested use of [employer] pumping log. [name] downloaded app and assisted mom with logging pumping details. They will use app over the next 24-hours to record pumping frequency and volume.   Milk volume expectations: Day 0-2 = approx. 37mL / 24 hours Day 3-7 = > / 24 hours Day 7-14 = > / 24 hours  Feeding Mother's Current Feeding Choice: Breast Milk and Donor Milk  LATCH Score: 8   Lactation Tools Discussed/Used Breast pump type: Double-Electric Breast Pump Reason for Pumping: preterm Pumping frequency: 7x yesterday Pumped volume: 20 mL (per pumping session)   Consult Status Consult Status: Follow-up Follow-up type: In-patient   Elder Negus, MA IBCLC 02/27/2021, 12:15 PM

## 2021-02-28 ENCOUNTER — Ambulatory Visit: Payer: Self-pay

## 2021-02-28 NOTE — Lactation Note (Signed)
This note was copied from a baby's chart. Lactation Consultation Note  Patient Name: Nicole Vance NATFT'D Date: 02/28/2021 Reason for consult: Follow-up assessment;Primapara;1st time breastfeeding;Late-preterm 34-36.6wks;NICU baby Age:33 days   LC in to assist with positioning and latching to breast.  Baby had fed for 5 min on right breast using 20 mm nipple shield before LC got there.  Milk noted in shield.  Offered to assist and Mom agreed.  Mom with large, heavy breasts with erect, small and short nipple shafts.  Baby rooting and latched onto left breast with little help.  Pulled down on chin to open his mouth wider.  Mom denies any pain.  After 5 mins, took baby off and noted Mom's nipple to be slightly pinched.  No swallows identifed.  After another attempt to latch without the nipple shield, assisted in football hold on right breast. LC changed NS to 16 mm size for a better fit currently.  Assisted Mom in latching baby deeply onto breast.  Lots of basic teaching done while baby fed for 10 mins.  Occasional swallows identified during feeding.  Mom taught to sandwich her breast and offer breast compression during sucking to aid in milk transfer.  Baby showed no signs of stress during feeding.  Baby came off breast satisfied and relaxed.  Nipple shield full of milk. Baby being gavage fed 1/3 of feeding.   Mom to continue to pump consistently, adding another pumping between 2-5 am.  Talked about "power pumping" once a day.  Mom's volume is picking up to 30 ml per pumping.  Mom to pump 15-30 mins with goal of 8 or more pumps in 24 hrs.   Mom desires another [facility] consult tomorrow at 11 am.   Feeding Nipple Type: Other (Dr. Irving Burton preemie wide base)  LATCH Score Latch: Grasps breast easily, tongue down, lips flanged, rhythmical sucking.  Audible Swallowing: A few with stimulation  Type of Nipple: Everted at rest and after stimulation  Comfort (Breast/Nipple): Soft / non-tender  Hold  (Positioning): Assistance needed to correctly position infant at breast and maintain latch.  LATCH Score: 8   Lactation Tools Discussed/Used Tools: Pump;Nipple Shields Nipple shield size: 16 Breast pump type: Double-Electric Breast Pump Pumping frequency: 7 times in 24 hrs Pumped volume: 30 mL  Interventions Interventions: Breast feeding basics reviewed;Skin to skin;Assisted with latch;Breast massage;Hand express;Breast compression;Adjust position;Support pillows;Position options;Ice;Expressed milk;DEBP;Education  Consult Status Consult Status: Follow-up Date: 03/01/21 Follow-up type: In-patient    Judee Clara 02/28/2021, 11:41 AM

## 2021-03-01 ENCOUNTER — Ambulatory Visit: Payer: Self-pay

## 2021-03-01 NOTE — Lactation Note (Signed)
This note was copied from a baby's chart. Lactation Consultation Note  Patient Name: Boy Saleemah Mollenhauer MEQAS'T Date: 03/01/2021 Reason for consult: NICU baby;Follow-up assessment (ac/pc weighed feeding) Age:33 days   LC to room for ac/pc weighted feeding. Mom used 71mm shield and latched independently. Baby remained latched with non-nutritive suckling for 19 minutes. Very few audible swallows heard. Baby removed . Mother and I reviewed feeding behaviors of 37-weekers. I encouraged mom to continue practicing bf'ing and pumping. Relayed to RN. Will plan f/u for continued bf support.   Maternal Data  Mom's milk supply continues to increase to normal range. Per her recall, yesterday she pumped between 30-28mLs for 8 pumping sessions. Her milk volume will likely increase further in coming days.   Day 0-2 = approx. 59mL / 24 hours Day 3-7 = > / 24 hours Day 7-14 = > / 24 hours  Feeding Mother's Current Feeding Choice: Breast Milk and Donor Milk Nipple Type: Other (wide based db preemie)  LATCH Score Latch: Grasps breast easily, tongue down, lips flanged, rhythmical sucking.  Audible Swallowing: A few with stimulation  Type of Nipple: Everted at rest and after stimulation  Comfort (Breast/Nipple): Soft / non-tender  Hold (Positioning): No assistance needed to correctly position infant at breast.  LATCH Score: 9   Lactation Tools Discussed/Used Pumping frequency: q3 Pumped volume: 40 mL  Consult Status Consult Status: Follow-up Follow-up type: In-patient   Elder Negus, MA IBCLC 03/01/2021, 2:36 PM

## 2021-03-02 ENCOUNTER — Ambulatory Visit: Payer: Self-pay

## 2021-03-02 NOTE — Lactation Note (Signed)
This note was copied from a baby's chart. Lactation Consultation Note  Patient Name: Nicole Vance AUQJF'H Date: 03/02/2021 Reason for consult: NICU baby;Follow-up assessment Age:33 days Mom's milk has increased to per pumping. Is wnl today. Infant to d/c. He continues to practice bf and is supplemented after with bottle. He will f/u with [facility] tomorrow. Mom will see LC at [facility] practice for continued care and to update poc.   Feeding Mother's Current Feeding Choice: Breast Milk Nipple Type: Other (DB wide base premie)    Lactation Tools Discussed/Used Pumping frequency: 8x Pumped volume: 45 mL  Discharge Discharge Education: Outpatient recommendation  Consult Status Consult Status: Complete Follow-up type: In-patient   Elder Negus, MA IBCLC 03/02/2021, 12:16 PM

## 2021-03-03 ENCOUNTER — Inpatient Hospital Stay (HOSPITAL_COMMUNITY): Admission: AD | Admit: 2021-03-03 | Payer: 59 | Source: Home / Self Care | Admitting: Obstetrics and Gynecology

## 2021-03-03 ENCOUNTER — Inpatient Hospital Stay (HOSPITAL_COMMUNITY): Payer: 59

## 2021-08-07 ENCOUNTER — Other Ambulatory Visit: Payer: Self-pay | Admitting: Obstetrics and Gynecology

## 2021-08-07 DIAGNOSIS — N63 Unspecified lump in unspecified breast: Secondary | ICD-10-CM

## 2021-11-27 ENCOUNTER — Ambulatory Visit
Admission: RE | Admit: 2021-11-27 | Discharge: 2021-11-27 | Disposition: A | Discharge status: Home / Self Care | Payer: 59 | Source: Ambulatory Visit | Attending: Obstetrics and Gynecology | Admitting: Obstetrics and Gynecology

## 2021-11-27 DIAGNOSIS — N63 Unspecified lump in unspecified breast: Secondary | ICD-10-CM

## 2022-12-29 IMAGING — MG DIGITAL DIAGNOSTIC BILAT W/ TOMO W/ CAD
6 of 10 series · 6 of 30 positions shown · non-contrast
Comparison: None.

CLINICAL DATA: Palpable abnormality in the RIGHT breast for 3-4
months. Patient stopped breastfeeding approximately 6 months ago.
Requisition states abnormality is in the 11 o'clock location 4
centimeters from the nipple. Patient shows me an area of concern in
the 1 o'clock location of the RIGHT breast. Both areas are evaluated
today.

EXAM:
DIGITAL DIAGNOSTIC BILATERAL MAMMOGRAM WITH TOMOSYNTHESIS AND CAD;
ULTRASOUND RIGHT BREAST LIMITED
TECHNIQUE: Bilateral digital diagnostic mammography and breast tomosynthesis
was performed. The images were evaluated with computer-aided
detection.; Targeted ultrasound examination of the right breast was
performed

[R TAN synth-2D]
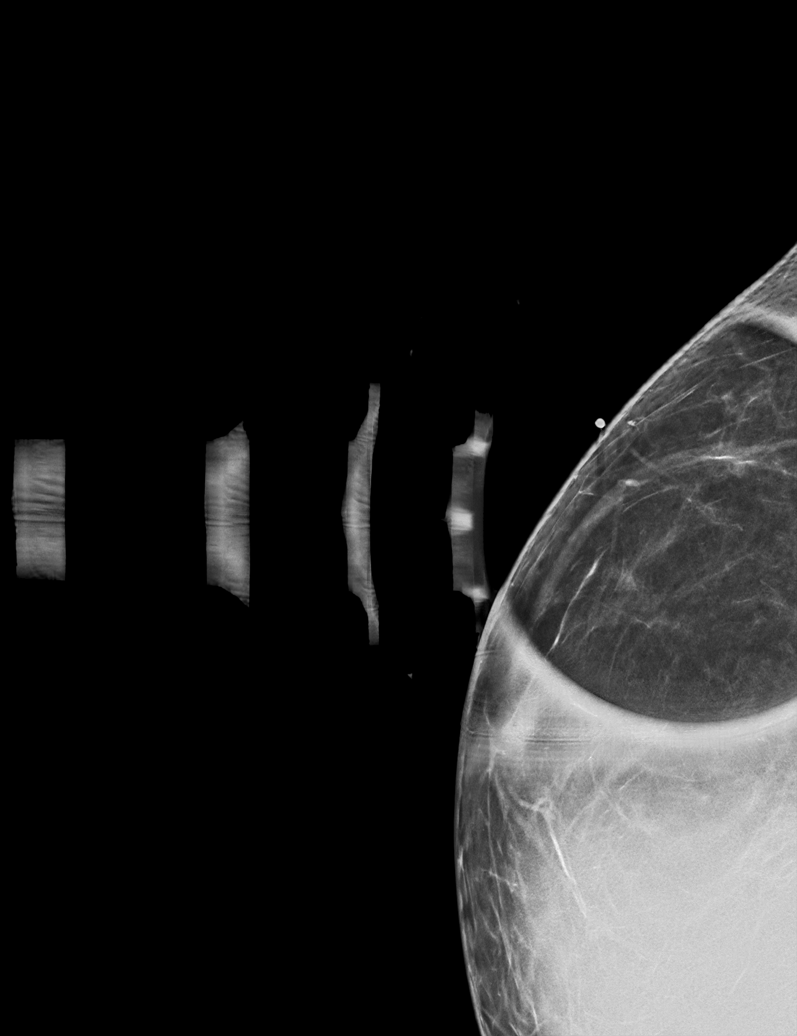

[R MLO synth-2D]
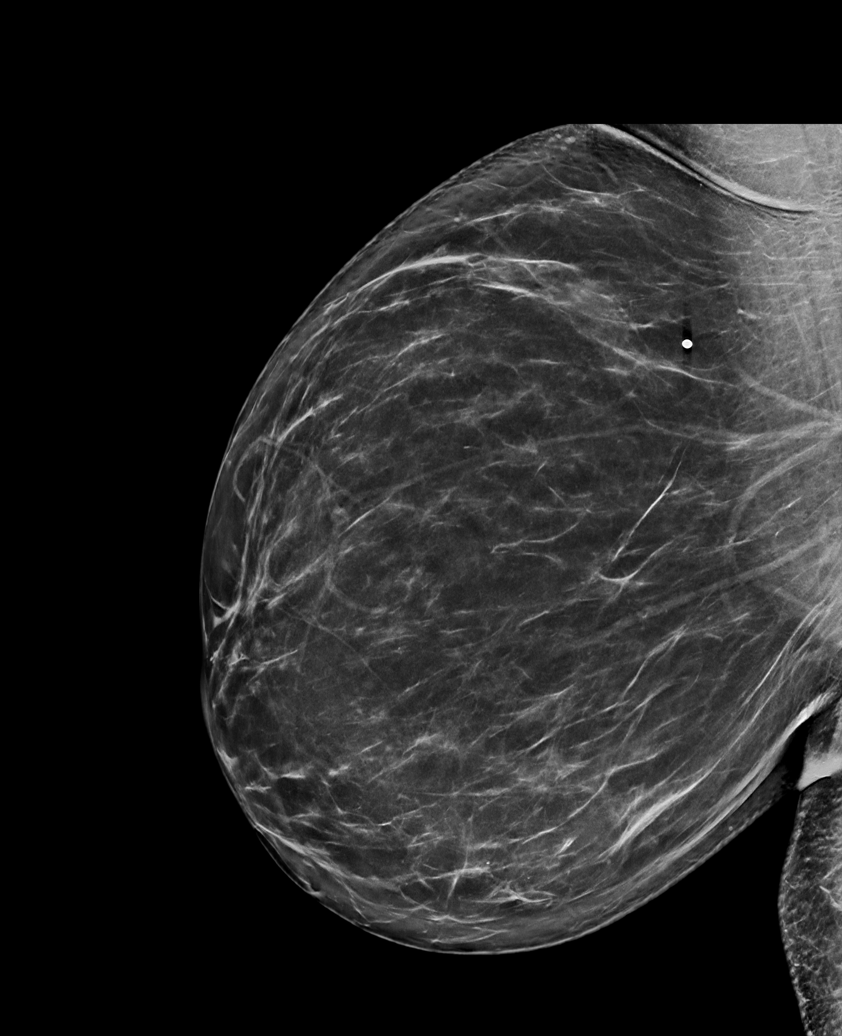

[R CC synth-2D]
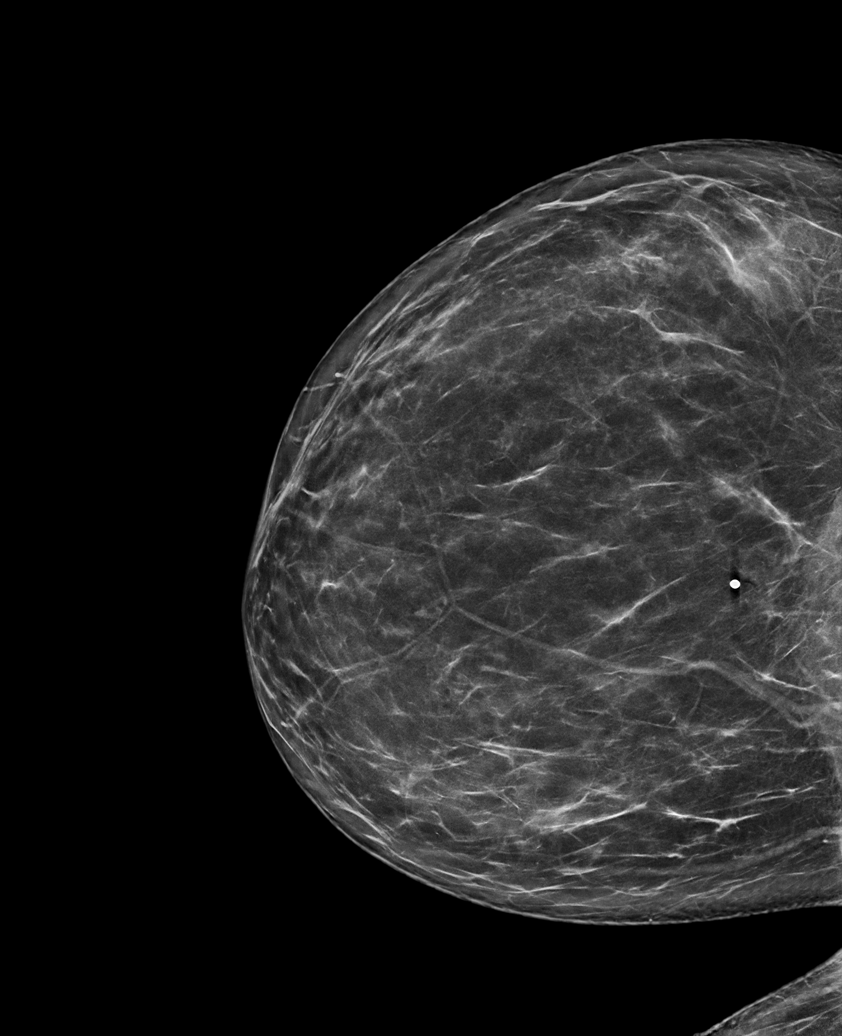

[L CC synth-2D]
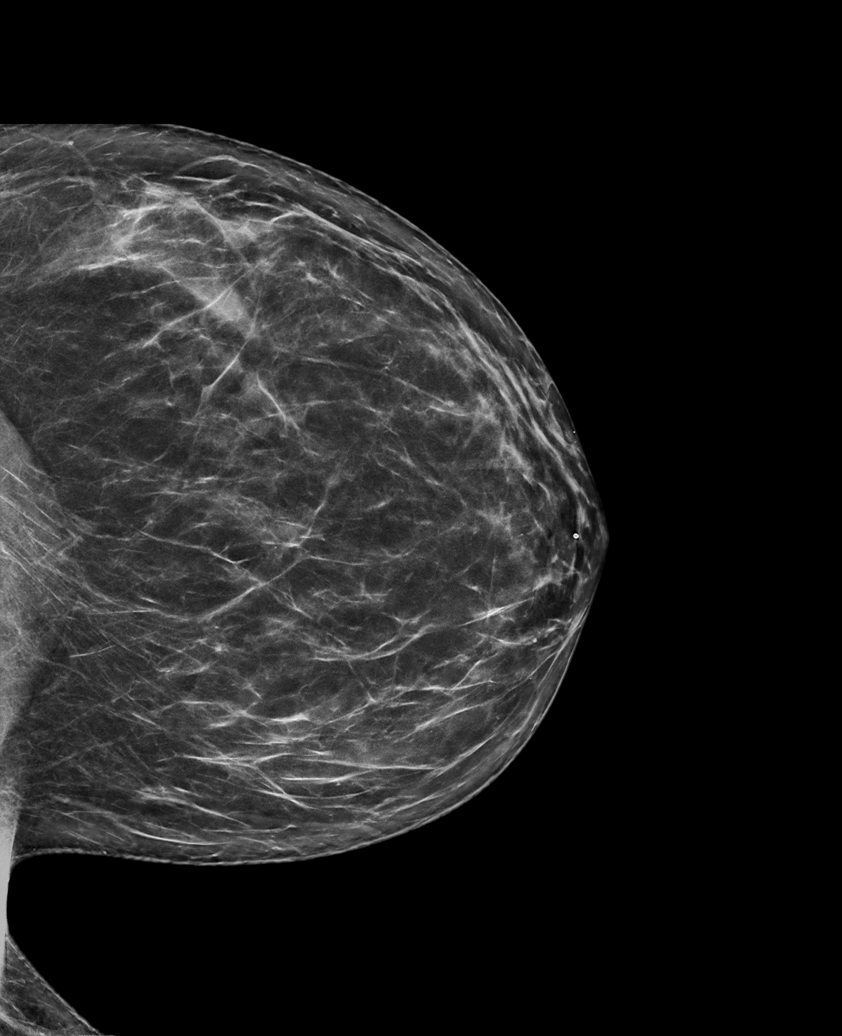

[L MLO synth-2D]
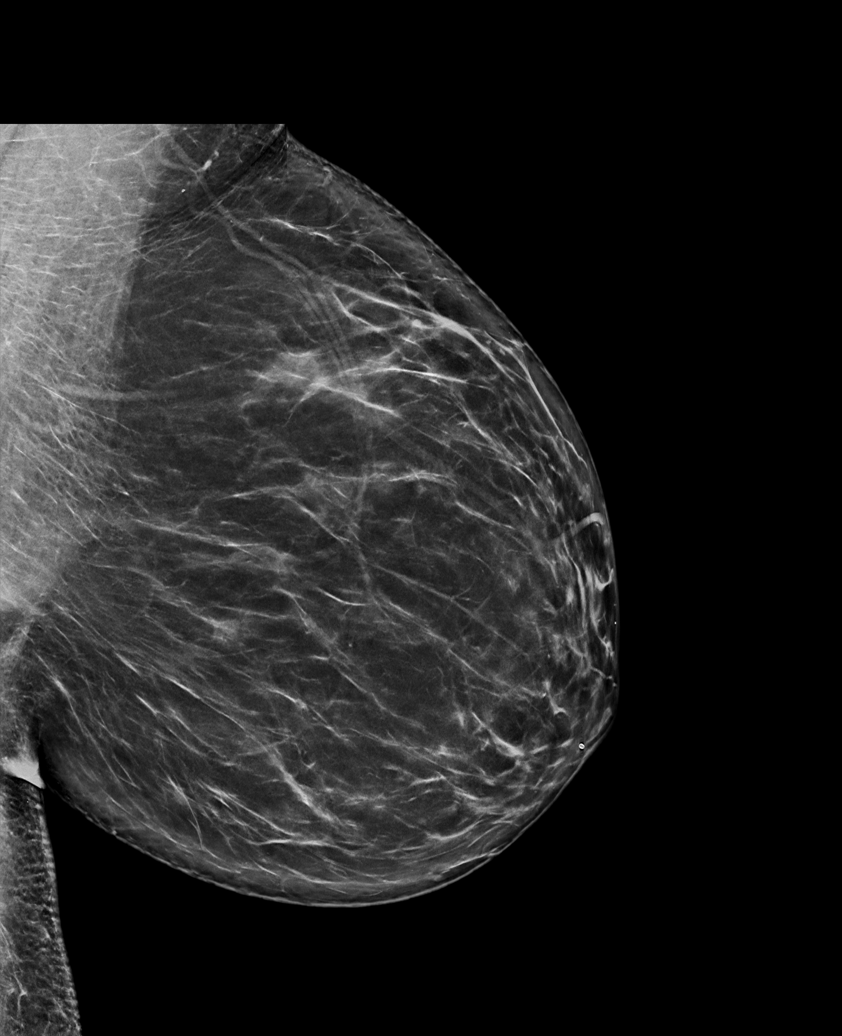

[R MLO tomo · tomo slice 43/84.0]
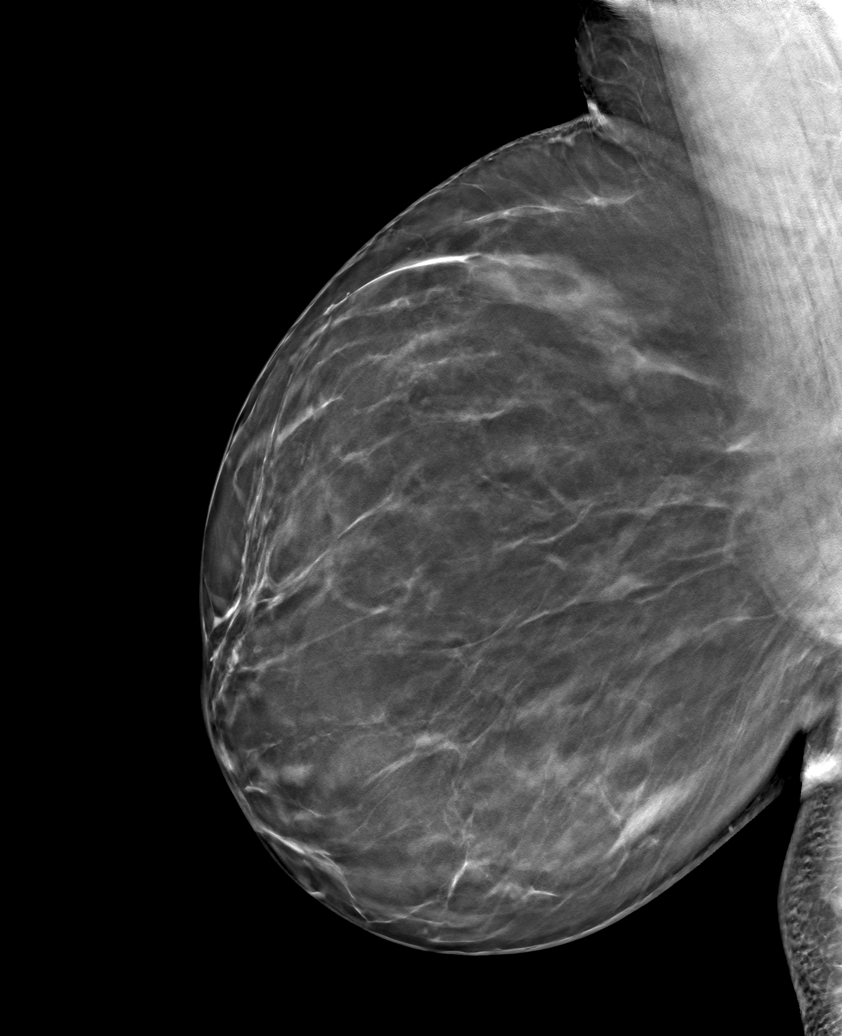

[6 of 30 positions shown; findings below may reference images not displayed]

ACR Breast Density Category b: There are scattered areas of
fibroglandular density.
FINDINGS: No suspicious mass, distortion, or microcalcifications are
identified to suggest presence of malignancy. Spot tangential view
is performed over the 1 o'clock location of the RIGHT breast and
shows normal appearing fibrofatty tissue.

On physical exam, I palpate soft nonfocal thickening in the 1
o'clock location of the RIGHT breast. I palpate no discrete mass in
the 11 o'clock or 1 o'clock locations.

Targeted ultrasound is performed, showing normal appearing
fibroglandular tissue in the UPPER portion of the RIGHT breast,
including the 11 o'clock and 1 o'clock sites. No suspicious mass,
distortion, or acoustic shadowing is demonstrated with ultrasound.
IMPRESSION: No mammographic or ultrasound evidence for malignancy.

RECOMMENDATION:
Recommend screening mammogram at age 40 unless there are persistent
or intervening clinical concerns. (Code:[id number])

I have discussed the findings and recommendations with the patient.
If applicable, a reminder letter will be sent to the patient
regarding the next appointment.

BI-RADS CATEGORY  1: Negative.

## 2023-10-01 ENCOUNTER — Inpatient Hospital Stay (HOSPITAL_COMMUNITY): Payer: 59

## 2023-10-01 ENCOUNTER — Inpatient Hospital Stay (HOSPITAL_COMMUNITY)
Admission: AD | Admit: 2023-10-01 | Discharge: 2023-10-01 | Disposition: A | Discharge status: Home / Self Care | Payer: 59 | Attending: Obstetrics & Gynecology | Admitting: Obstetrics & Gynecology

## 2023-10-01 ENCOUNTER — Encounter (HOSPITAL_COMMUNITY): Payer: Self-pay | Admitting: *Deleted

## 2023-10-01 DIAGNOSIS — O021 Missed abortion: Secondary | ICD-10-CM | POA: Diagnosis not present

## 2023-10-01 DIAGNOSIS — O039 Complete or unspecified spontaneous abortion without complication: Secondary | ICD-10-CM | POA: Diagnosis not present

## 2023-10-01 DIAGNOSIS — O209 Hemorrhage in early pregnancy, unspecified: Secondary | ICD-10-CM

## 2023-10-01 DIAGNOSIS — Z3A11 11 weeks gestation of pregnancy: Secondary | ICD-10-CM | POA: Diagnosis present

## 2023-10-01 DIAGNOSIS — O09521 Supervision of elderly multigravida, first trimester: Secondary | ICD-10-CM | POA: Diagnosis present

## 2023-10-01 DIAGNOSIS — Z3A08 8 weeks gestation of pregnancy: Secondary | ICD-10-CM

## 2023-10-01 DIAGNOSIS — O99351 Diseases of the nervous system complicating pregnancy, first trimester: Secondary | ICD-10-CM | POA: Diagnosis present

## 2023-10-01 LAB — CBC
HCT: 36.9 % (ref 36.0–46.0)
Hemoglobin: 12.9 g/dL (ref 12.0–15.0)
MCH: 30.5 pg (ref 26.0–34.0)
MCHC: 35 g/dL (ref 30.0–36.0)
MCV: 87.2 fL (ref 80.0–100.0)
Platelets: 275 10*3/uL (ref 150–400)
RBC: 4.23 MIL/uL (ref 3.87–5.11)
RDW: 12.2 % (ref 11.5–15.5)
WBC: 7.5 10*3/uL (ref 4.0–10.5)
nRBC: 0 % (ref 0.0–0.2)

## 2023-10-01 LAB — URINALYSIS, ROUTINE W REFLEX MICROSCOPIC
Bilirubin Urine: NEGATIVE
Glucose, UA: NEGATIVE mg/dL
Ketones, ur: NEGATIVE mg/dL
Leukocytes,Ua: NEGATIVE
Nitrite: NEGATIVE
Protein, ur: NEGATIVE mg/dL
Specific Gravity, Urine: 1.015 (ref 1.005–1.030)
pH: 6 (ref 5.0–8.0)

## 2023-10-01 LAB — WET PREP, GENITAL
Clue Cells Wet Prep HPF POC: NONE SEEN
Sperm: NONE SEEN
Trich, Wet Prep: NONE SEEN
WBC, Wet Prep HPF POC: >=10 — AB (ref <10)
Yeast Wet Prep HPF POC: NONE SEEN

## 2023-10-01 LAB — HCG, QUANTITATIVE, PREGNANCY: hCG, Beta Chain, Quant, S: 1381 m[IU]/mL — ABNORMAL HIGH (ref <5)

## 2023-10-01 MED ORDER — PROMETHAZINE HCL 12.5 MG PO TABS: 12.5000 mg | ORAL_TABLET | Freq: Four times a day (QID) | ORAL | 0 refills | Status: DC | PRN

## 2023-10-01 MED ORDER — IBUPROFEN 600 MG PO TABS
600.0000 mg | ORAL_TABLET | Freq: Four times a day (QID) | ORAL | 0 refills | Status: DC | PRN
Start: 2023-10-01 — End: 2024-08-20

## 2023-10-01 MED ORDER — MISOPROSTOL 200 MCG PO TABS: ORAL_TABLET | ORAL | 0 refills | Status: DC

## 2023-10-01 MED ORDER — TRAMADOL HCL 50 MG PO TABS: 50.0000 mg | ORAL_TABLET | Freq: Four times a day (QID) | ORAL | 0 refills | Status: DC | PRN

## 2023-10-01 NOTE — MAU Note (Signed)
Nicole Vance is a 37 y.o. at [redacted]w[redacted]d here in MAU reporting: yesterday around 10, noted some vag bleed, very light. Every time she went to the bathroom, was seeing when she wiped- continued . None during the night.  Seeing more when she wipes this morning, was bright red with small clots  after bowel movement, also had some cramps after BM like period was going to start.  Onset of complaint: yesterday Pain score: mild cramps Vitals:   10/01/23 1234  BP: (!) 148/91  Pulse: 81  Resp: 17  Temp: 98.7 F (37.1 C)  SpO2: 100%     ZOX:[id number] heard flicker, briefly, unable to catch.  Lab orders placed from triage:  urine

## 2023-10-01 NOTE — MAU Provider Note (Cosign Needed Addendum)
Chief Complaint: Vaginal Bleeding and Abdominal Pain   Event Date/Time   First Provider Initiated Contact with Patient 10/01/23 1625      SUBJECTIVE HPI: Nicole Vance is a 37 y.o. G2P0101 at [redacted]w[redacted]d by sure LMP who presents to maternity admissions reporting onset of light vaginal bleeding last night then increased bleeding and onset of cramping today.     HPI  Past Medical History:  Diagnosis Date   Acne    Asthma in adult, mild intermittent, uncomplicated    with exercise   Cerebral palsy (HCC)    no deficits   Past Surgical History:  Procedure Laterality Date   oral surgery     Social History   Socioeconomic History   Marital status: Married    Spouse name: Not on file   Number of children: Not on file   Years of education: Not on file   Highest education level: Not on file  Occupational History   Not on file  Tobacco Use   Smoking status: Never   Smokeless tobacco: Never  Substance and Sexual Activity   Alcohol use: Yes    Alcohol/week: 2.0 standard drinks of alcohol    Types: 2 Glasses of wine per week   Drug use: No   Sexual activity: Yes    Partners: Male    Birth control/protection: Condom  Other Topics Concern   Not on file  Social History Narrative   ** Merged History Encounter **       Social Determinants of Health   Financial Resource Strain: Not on file  Food Insecurity: Not on file  Transportation Needs: Not on file  Physical Activity: Not on file  Stress: Not on file  Social Connections: Unknown (04/17/2022)   Received from [facility], [facility]   Social Network    Social Network: Not on file  Intimate Partner Violence: Unknown (03/11/2022)   Received from [facility], [facility]   HITS    Physically Hurt: Not on file    Insult or Talk Down To: Not on file    Threaten Physical Harm: Not on file    Scream or Curse: Not on file   No current facility-administered medications on file prior to encounter.   Current Outpatient  Medications on File Prior to Encounter  Medication Sig Dispense Refill   albuterol (VENTOLIN HFA) 108 (90 Base) MCG/ACT inhaler Inhale 1 puff into the lungs every 6 (six) hours as needed for wheezing or shortness of breath.     Prenatal Vit-Fe Fumarate-FA (PRENATAL MULTIVITAMIN) TABS tablet Take 1 tablet by mouth daily at 12 noon.     Allergies  Allergen Reactions   Cashew Nut Oil Hives and Swelling    Allergy to tree nuts    ROS:  Review of Systems  Constitutional:  Negative for chills, fatigue and fever.  Respiratory:  Negative for shortness of breath.   Cardiovascular:  Negative for chest pain.  Gastrointestinal:  Positive for abdominal pain.  Genitourinary:  Positive for vaginal bleeding. Negative for difficulty urinating, dysuria, flank pain, pelvic pain, vaginal discharge and vaginal pain.  Neurological:  Negative for dizziness and headaches.  Psychiatric/Behavioral: Negative.       I have reviewed patient's Past Medical Hx, Surgical Hx, Family Hx, Social Hx, medications and allergies.   Physical Exam  Patient Vitals for the past 24 hrs:  BP Temp Temp src Pulse Resp SpO2 Height Weight  10/01/23 1234 (!) 148/91 98.7 F (37.1 C) Oral 81 17 100 %  5\' 2"  (1.575 m) 82.8 kg   Constitutional: Well-developed, well-nourished female in no acute distress.  Cardiovascular: normal rate Respiratory: normal effort GI: Abd soft, non-tender. Pos BS x 4 MS: Extremities nontender, no edema, normal ROM Neurologic: Alert and oriented x 4.  GU: Neg CVAT.  PELVIC EXAM: self swabs collected   LAB RESULTS Results for orders placed or performed during the hospital encounter of 10/01/23 (from the past 24 hour(s))  Urinalysis, Routine w reflex microscopic -Urine, Clean Catch     Status: Abnormal   Collection Time: 10/01/23 12:41 PM  Result Value Ref Range   Color, Urine YELLOW YELLOW   APPearance CLEAR CLEAR   Specific Gravity, Urine 1.015 1.005 - 1.030   pH 6.0 5.0 - 8.0   Glucose, UA  NEGATIVE NEGATIVE mg/dL   Hgb urine dipstick LARGE (A) NEGATIVE   Bilirubin Urine NEGATIVE NEGATIVE   Ketones, ur NEGATIVE NEGATIVE mg/dL   Protein, ur NEGATIVE NEGATIVE mg/dL   Nitrite NEGATIVE NEGATIVE   Leukocytes,Ua NEGATIVE NEGATIVE   RBC / HPF 0-5 0 - 5 RBC/hpf   WBC, UA 0-5 0 - 5 WBC/hpf   Bacteria, UA RARE (A) NONE SEEN   Squamous Epithelial / HPF 0-5 0 - 5 /HPF   Mucus PRESENT   CBC     Status: None   Collection Time: 10/01/23  1:45 PM  Result Value Ref Range   WBC 7.5 4.0 - 10.5 K/uL   RBC 4.23 3.87 - 5.11 MIL/uL   Hemoglobin 12.9 12.0 - 15.0 g/dL   HCT 53.6 14.4 - 31.5 %   MCV 87.2 80.0 - 100.0 fL   MCH 30.5 26.0 - 34.0 pg   MCHC 35.0 30.0 - 36.0 g/dL   RDW 40.0 86.7 - 61.9 %   Platelets 275 150 - 400 K/uL   nRBC 0.0 0.0 - 0.2 %  hCG, quantitative, pregnancy     Status: Abnormal   Collection Time: 10/01/23  1:45 PM  Result Value Ref Range   hCG, Beta Chain, Quant, S 1,381 (H) <5 mIU/mL  Wet prep, genital     Status: Abnormal   Collection Time: 10/01/23  1:56 PM   Specimen: Urine, Clean Catch  Result Value Ref Range   Yeast Wet Prep HPF POC NONE SEEN NONE SEEN   Trich, Wet Prep NONE SEEN NONE SEEN   Clue Cells Wet Prep HPF POC NONE SEEN NONE SEEN   WBC, Wet Prep HPF POC >=10 (A) <10   Sperm NONE SEEN        IMAGING US OB LESS THAN 14 WEEKS WITH OB TRANSVAGINAL  Result Date: 10/01/2023 CLINICAL DATA:  Bleeding EXAM: OBSTETRIC <14 WK ULTRASOUND TECHNIQUE: Transabdominal ultrasound was performed for evaluation of the gestation as well as the maternal uterus and adnexal regions. COMPARISON:  None Available. FINDINGS: Intrauterine gestational sac: Single Yolk sac:  Not Visualized. Embryo:  Visualized. Cardiac Activity: Not Visualized. CRL: 17.8  mm   8 w 2 d                  [place] EDC: 05/10/2024 Subchorionic hemorrhage:  None visualized. Maternal uterus/adnexae: Left corpus luteum cyst. Normal appearance of the bilateral ovaries. Trace free fluid in pelvis.  IMPRESSION: Embryo with no cardiac activity visualized. Crown-rump length (CRL) of ?7 mm and no heartbeat is diagnostic of pregnancy failure. Electronically Signed   By: Allegra Lai M.D.   On: 10/01/2023 16:34    MAU Management/MDM: Orders Placed This Encounter  Procedures  Wet prep, genital   US OB LESS THAN 14 WEEKS WITH OB TRANSVAGINAL   Urinalysis, Routine w reflex microscopic -Urine, Clean Catch   CBC   hCG, quantitative, pregnancy   Discharge patient    Meds ordered this encounter  Medications   misoprostol (CYTOTEC) 200 MCG tablet    Sig: Place all 4 tablets in your cheek and let them soften, then swallow them.    Dispense:  4 tablet    Refill:  0    Order Specific Question:   Supervising Provider    Answer:   Reva Bores [[id number]]   ibuprofen (ADVIL) 600 MG tablet    Sig: Take 1 tablet (600 mg total) by mouth every 6 (six) hours as needed.    Dispense:  30 tablet    Refill:  0    Order Specific Question:   Supervising Provider    Answer:   Reva Bores [[id number]]   promethazine (PHENERGAN) 12.5 MG tablet    Sig: Take 1-2 tablets (12.5-25 mg total) by mouth every 6 (six) hours as needed for nausea or vomiting.    Dispense:  30 tablet    Refill:  0    Order Specific Question:   Supervising Provider    Answer:   Reva Bores [[id number]]   traMADol (ULTRAM) 50 MG tablet    Sig: Take 1-2 tablets (50-100 mg total) by mouth every 6 (six) hours as needed. Take no more than 6 tablets in a 24 hour period.    Dispense:  5 tablet    Refill:  0    Order Specific Question:   Supervising Provider    Answer:   Reva Bores [[id number]]    Missed ab with CRL c/w [redacted]w[redacted]d without FHR.  Reviewed results with pt today and questions answered. Discussed options with patient including expectant management, Cytotec Rx, or D&C scheduled in 2-3 days to 1 week.  Benefits/risks discussed for all options.  All options include close follow up in the office.  Pt is undecided but would like to schedule D&C  but have a prescription for Cytotec in case she wants to take it.  Rx for Cytotec, Phenergan, Tramadol, and Ibuprofen sent to pharmacy.  Called Dr Reina Fuse, who will coordinate scheduling D&C with [facility].    Bleeding/return precautions reviewed.  Keepsake given, and ANORA testing offered and kit sent home with patient.    ASSESSMENT 1. Missed ab   2. [redacted] weeks gestation of pregnancy   3. Vaginal bleeding affecting early pregnancy     PLAN Discharge home Allergies as of 10/01/2023       Reactions   Cashew Nut Oil Hives, Swelling   Allergy to tree nuts        Medication List     STOP taking these medications    NIFEdipine 90 MG 24 hr tablet Commonly known as: PROCARDIA XL/NIFEDICAL-XL   norelgestromin-ethinyl estradiol 150-35 MCG/24HR transdermal patch Commonly known as: Ortho Evra       TAKE these medications    albuterol 108 (90 Base) MCG/ACT inhaler Commonly known as: VENTOLIN HFA Inhale 1 puff into the lungs every 6 (six) hours as needed for wheezing or shortness of breath.   ibuprofen 600 MG tablet Commonly known as: ADVIL Take 1 tablet (600 mg total) by mouth every 6 (six) hours as needed.   misoprostol 200 MCG tablet Commonly known as: Cytotec Place all 4 tablets in your cheek and let them soften, then swallow them.  prenatal multivitamin Tabs tablet Take 1 tablet by mouth daily at 12 noon.   promethazine 12.5 MG tablet Commonly known as: PHENERGAN Take 1-2 tablets (12.5-25 mg total) by mouth every 6 (six) hours as needed for nausea or vomiting.   traMADol 50 MG tablet Commonly known as: ULTRAM Take 1-2 tablets (50-100 mg total) by mouth every 6 (six) hours as needed. Take no more than 6 tablets in a 24 hour period.        Follow-up Information     Ob/Gyn, [facility] Follow up.   Why: The office will call next week to schedule your D&C procedure and follow up visits. Contact information: [address] [place]  [address] [phone]         [facility] Follow up.   Specialty: Obstetrics and Gynecology Why: As needed for emergencies Contact information: [address] [place] [address] [phone]                Sharen Counter Certified Nurse-Midwife 10/01/2023  6:43 PM

## 2023-10-02 ENCOUNTER — Encounter (HOSPITAL_BASED_OUTPATIENT_CLINIC_OR_DEPARTMENT_OTHER): Payer: Self-pay

## 2023-10-02 ENCOUNTER — Inpatient Hospital Stay (HOSPITAL_BASED_OUTPATIENT_CLINIC_OR_DEPARTMENT_OTHER)
Admission: AD | Admit: 2023-10-02 | Discharge: 2023-10-02 | Disposition: A | Discharge status: Home / Self Care | Payer: 59 | Attending: Emergency Medicine | Admitting: Emergency Medicine

## 2023-10-02 ENCOUNTER — Other Ambulatory Visit: Payer: Self-pay

## 2023-10-02 ENCOUNTER — Inpatient Hospital Stay (HOSPITAL_COMMUNITY): Payer: 59

## 2023-10-02 DIAGNOSIS — Z3A11 11 weeks gestation of pregnancy: Secondary | ICD-10-CM | POA: Diagnosis not present

## 2023-10-02 DIAGNOSIS — O034 Incomplete spontaneous abortion without complication: Secondary | ICD-10-CM | POA: Insufficient documentation

## 2023-10-02 DIAGNOSIS — O039 Complete or unspecified spontaneous abortion without complication: Secondary | ICD-10-CM | POA: Diagnosis present

## 2023-10-02 LAB — CBC WITH DIFFERENTIAL/PLATELET
Abs Immature Granulocytes: 0.02 10*3/uL (ref 0.00–0.07)
Basophils Absolute: 0 10*3/uL (ref 0.0–0.1)
Basophils Relative: 0 %
Eosinophils Absolute: 0.2 10*3/uL (ref 0.0–0.5)
Eosinophils Relative: 2 %
HCT: 34.3 % — ABNORMAL LOW (ref 36.0–46.0)
Hemoglobin: 12.2 g/dL (ref 12.0–15.0)
Immature Granulocytes: 0 %
Lymphocytes Relative: 26 %
Lymphs Abs: 1.7 10*3/uL (ref 0.7–4.0)
MCH: 31.4 pg (ref 26.0–34.0)
MCHC: 35.6 g/dL (ref 30.0–36.0)
MCV: 88.2 fL (ref 80.0–100.0)
Monocytes Absolute: 0.4 10*3/uL (ref 0.1–1.0)
Monocytes Relative: 6 %
Neutro Abs: 4.3 10*3/uL (ref 1.7–7.7)
Neutrophils Relative %: 66 %
Platelets: 252 10*3/uL (ref 150–400)
RBC: 3.89 MIL/uL (ref 3.87–5.11)
RDW: 12.3 % (ref 11.5–15.5)
WBC: 6.6 10*3/uL (ref 4.0–10.5)
nRBC: 0 % (ref 0.0–0.2)

## 2023-10-02 LAB — HCG, QUANTITATIVE, PREGNANCY: hCG, Beta Chain, Quant, S: 701 m[IU]/mL — ABNORMAL HIGH (ref <5)

## 2023-10-02 MED ORDER — KETOROLAC TROMETHAMINE 60 MG/2ML IM SOLN
60.0000 mg | Freq: Once | INTRAMUSCULAR | Status: DC
Start: 2023-10-02 — End: 2023-10-02

## 2023-10-02 MED ORDER — KETOROLAC TROMETHAMINE 30 MG/ML IJ SOLN
30.0000 mg | Freq: Once | INTRAMUSCULAR | Status: AC
  Administered 2023-10-02: 30 mg via INTRAVENOUS
  Filled 2023-10-02: qty 1

## 2023-10-02 MED ORDER — MISOPROSTOL 200 MCG PO TABS: 200.0000 ug | ORAL_TABLET | Freq: Three times a day (TID) | ORAL | 0 refills | Status: DC

## 2023-10-02 NOTE — MAU Provider Note (Signed)
Chief Complaint:  Miscarriage, Abdominal Pain, and Vaginal Bleeding  HPI  Nicole Vance is a 37 y.o. [phone] at [redacted]w[redacted]d who presents to maternity admissions reporting likely complete miscarriage - was diagnosed with a failed pregnancy yesterday and sent home for expectant management. Began bleeding overnight, thinks she passed the gestational sac but then continued to have bright red bleeding and additional clots. Presented to [facility] and was transferred here. Since arrival, bleeding has slowed considerably but she is still having waves of cramps that are 7-8/10 when they hit. No other physical complaints.   Pregnancy Course: Receives care from [facility], previous notes reviewed.  Past Medical History:  Diagnosis Date   Acne    Asthma in adult, mild intermittent, uncomplicated    with exercise   Cerebral palsy (HCC)    no deficits   OB History  Gravida Para Term Preterm AB Living  6 1 0 1 4 1   SAB IAB Ectopic Multiple Live Births  4 0 0   1    # Outcome Date GA Lbr Len/2nd Weight Sex Type Anes PTL Lv  6 Current           5 Preterm 02/21/21 [redacted]w[redacted]d 18:30 / 02:33 5 lb 13.8 oz (2.66 kg) M Vag-Spont EPI  LIV  4 SAB 2021     SAB     3 SAB 2021     SAB     2 SAB 2021     SAB     1 SAB 2021     SAB      Past Surgical History:  Procedure Laterality Date   oral surgery     Family History  Problem Relation Age of Onset   Diabetes Mellitus II Paternal Grandmother    Diabetes Mellitus II Paternal Grandfather    Social History   Tobacco Use   Smoking status: Never   Smokeless tobacco: Never  Vaping Use   Vaping status: Never Used  Substance Use Topics   Alcohol use: Not Currently    Alcohol/week: 2.0 standard drinks of alcohol    Types: 2 Glasses of wine per week   Drug use: No   Allergies  Allergen Reactions   Cashew Nut Oil Hives and Swelling    Allergy to tree nuts   Medications Prior to Admission  Medication Sig Dispense Refill Last Dose   albuterol (VENTOLIN  HFA) 108 (90 Base) MCG/ACT inhaler Inhale 1 puff into the lungs every 6 (six) hours as needed for wheezing or shortness of breath.      ibuprofen (ADVIL) 600 MG tablet Take 1 tablet (600 mg total) by mouth every 6 (six) hours as needed. 30 tablet 0    misoprostol (CYTOTEC) 200 MCG tablet Place all 4 tablets in your cheek and let them soften, then swallow them. 4 tablet 0    Prenatal Vit-Fe Fumarate-FA (PRENATAL MULTIVITAMIN) TABS tablet Take 1 tablet by mouth daily at 12 noon.      promethazine (PHENERGAN) 12.5 MG tablet Take 1-2 tablets (12.5-25 mg total) by mouth every 6 (six) hours as needed for nausea or vomiting. 30 tablet 0    traMADol (ULTRAM) 50 MG tablet Take 1-2 tablets (50-100 mg total) by mouth every 6 (six) hours as needed. Take no more than 6 tablets in a 24 hour period. 5 tablet 0    I have reviewed patient's Past Medical Hx, Surgical Hx, Family Hx, Social Hx, medications and allergies.   ROS  Pertinent items noted in HPI  and remainder of comprehensive ROS otherwise negative.   PHYSICAL EXAM  Patient Vitals for the past 24 hrs:  BP Temp Temp src Pulse Resp SpO2 Height Weight  10/02/23 0911 (!) 120/91 98 F (36.7 C) Oral (!) 103 16 99 % 5\' 2"  (1.575 m) 179 lb 14.4 oz (81.6 kg)  10/02/23 0619 -- -- -- -- -- -- 5\' 2"  (1.575 m) 179 lb (81.2 kg)  10/02/23 0610 (!) 128/92 97.9 F (36.6 C) Oral 82 18 100 % -- --   Constitutional: Well-developed, well-nourished female in no acute distress.  Cardiovascular: normal rate & rhythm, warm and well-perfused Respiratory: normal effort, no problems with respiration noted GI: Abd soft, non-tender, non-distended MS: Extremities nontender, no edema, normal ROM Neurologic: Alert and oriented x 4.  GU: no CVA tenderness Pelvic: full exam deferred, scant bleeding on pad - sent to ultrasound   Labs: Results for orders placed or performed during the hospital encounter of 10/02/23 (from the past 24 hour(s))  CBC with Differential     Status:  Abnormal   Collection Time: 10/02/23  6:35 AM  Result Value Ref Range   WBC 6.6 4.0 - 10.5 K/uL   RBC 3.89 3.87 - 5.11 MIL/uL   Hemoglobin 12.2 12.0 - 15.0 g/dL   HCT 16.1 (L) 09.6 - 04.5 %   MCV 88.2 80.0 - 100.0 fL   MCH 31.4 26.0 - 34.0 pg   MCHC 35.6 30.0 - 36.0 g/dL   RDW 40.9 81.1 - 91.4 %   Platelets 252 150 - 400 K/uL   nRBC 0.0 0.0 - 0.2 %   Neutrophils Relative % 66 %   Neutro Abs 4.3 1.7 - 7.7 K/uL   Lymphocytes Relative 26 %   Lymphs Abs 1.7 0.7 - 4.0 K/uL   Monocytes Relative 6 %   Monocytes Absolute 0.4 0.1 - 1.0 K/uL   Eosinophils Relative 2 %   Eosinophils Absolute 0.2 0.0 - 0.5 K/uL   Basophils Relative 0 %   Basophils Absolute 0.0 0.0 - 0.1 K/uL   Immature Granulocytes 0 %   Abs Immature Granulocytes 0.02 0.00 - 0.07 K/uL  hCG, quantitative, pregnancy     Status: Abnormal   Collection Time: 10/02/23  6:35 AM  Result Value Ref Range   hCG, Beta Chain, Quant, S 701 (H) <5 mIU/mL   Imaging:  US OB Transvaginal  Result Date: 10/02/2023 CLINICAL DATA:  Abortion in progress.  Vaginal bleeding. EXAM: TRANSVAGINAL OB ULTRASOUND TECHNIQUE: Transvaginal ultrasound was performed for complete evaluation of the gestation as well as the maternal uterus, adnexal regions, and pelvic cul-de-sac. COMPARISON:  10/01/2023 FINDINGS: Intrauterine gestational sac: None Yolk sac:  Not Visualized. Embryo:  Not Visualized. Cardiac Activity: Not Visualized. Maternal uterus/adnexae: Right ovary: Normal Left ovary: Normal Other :Thickened and heterogeneous endometrium extending into the lower uterine segment is identified measuring up to 19 mm in thickness at the level of the distal canal. No increased vascularity identified. Free fluid:  None IMPRESSION: 1. Findings meet definitive criteria for failed pregnancy. This follows SRU consensus guidelines: Diagnostic Criteria for Nonviable Pregnancy Early in the First Trimester. [facility] Mis J Med [phone]. 2. Thickened and heterogeneous  endometrium which in the appropriate clinical setting may reflect abortion in progress. 3. If following spontaneous abortion there is persistent thickening of the endometrium findings may reflect retained products of conception. Electronically Signed   By: Signa Kell M.D.   On: 10/02/2023 11:18   US OB LESS THAN 14 WEEKS WITH OB TRANSVAGINAL  Result Date: 10/01/2023 CLINICAL DATA:  Bleeding EXAM: OBSTETRIC <14 WK ULTRASOUND TECHNIQUE: Transabdominal ultrasound was performed for evaluation of the gestation as well as the maternal uterus and adnexal regions. COMPARISON:  None Available. FINDINGS: Intrauterine gestational sac: Single Yolk sac:  Not Visualized. Embryo:  Visualized. Cardiac Activity: Not Visualized. CRL: 17.8  mm   8 w 2 d                  [place] EDC: 05/10/2024 Subchorionic hemorrhage:  None visualized. Maternal uterus/adnexae: Left corpus luteum cyst. Normal appearance of the bilateral ovaries. Trace free fluid in pelvis. IMPRESSION: Embryo with no cardiac activity visualized. Crown-rump length (CRL) of ?7 mm and no heartbeat is diagnostic of pregnancy failure. Electronically Signed   By: Allegra Lai M.D.   On: 10/01/2023 16:34    MDM & MAU COURSE  MDM: Moderate  MAU Course: Orders Placed This Encounter  Procedures   US OB Transvaginal   CBC with Differential   hCG, quantitative, pregnancy   Von Willebrand panel   Antiphospholipid syndrome eval, bld   Secure IV for POV transfer   Discharge patient   Meds ordered this encounter  Medications   DISCONTD: ketorolac (TORADOL) injection 60 mg   ketorolac (TORADOL) 30 MG/ML injection 30 mg   misoprostol (CYTOTEC) 200 MCG tablet    Sig: Take 1 tablet (200 mcg total) by mouth 3 (three) times daily for 4 days.    Dispense:  12 tablet    Refill:  0   Bleeding greatly subsided by the time she got to MAU, still having cramping. Ordered Toradol and repeat ultrasound to assess for POCs. Pt amenable to plan.  Ultrasound showed  absence of gestational sac and thickened endometrial lining confirming patient suspicions. Reviewed images with Dr. Macon Large who agreed with the thickened endometrial lining, ok to offer cytotec therapy at home. Opted for lower dose over 4 days to help expel remaining lining, script sent to pharmacy.  They collected the gestational sac to send in their Anora kit at home. Asked if there is any way to know why this has now happened 4x, has not had any bloodwork drawn to investigate. Was going to see fertility specialists but then got pregnant. Has no trouble getting pregnant but keeps miscarrying in the first trimester. Offered to draw VWB and APL panels so she has results to take to the fertility specialist to which she agreed.   ASSESSMENT   1. Miscarriage    PLAN  Discharge home in stable condition with bleeding precautions.     Allergies as of 10/02/2023       Reactions   Cashew Nut Oil Hives, Swelling   Allergy to tree nuts        Medication List     TAKE these medications    albuterol 108 (90 Base) MCG/ACT inhaler Commonly known as: VENTOLIN HFA Inhale 1 puff into the lungs every 6 (six) hours as needed for wheezing or shortness of breath.   ibuprofen 600 MG tablet Commonly known as: ADVIL Take 1 tablet (600 mg total) by mouth every 6 (six) hours as needed.   misoprostol 200 MCG tablet Commonly known as: Cytotec Take 1 tablet (200 mcg total) by mouth 3 (three) times daily for 4 days. What changed:  how much to take how to take this when to take this additional instructions   prenatal multivitamin Tabs tablet Take 1 tablet by mouth daily at 12 noon.   promethazine 12.5 MG tablet Commonly  known as: PHENERGAN Take 1-2 tablets (12.5-25 mg total) by mouth every 6 (six) hours as needed for nausea or vomiting.   traMADol 50 MG tablet Commonly known as: ULTRAM Take 1-2 tablets (50-100 mg total) by mouth every 6 (six) hours as needed. Take no more than 6 tablets in a 24  hour period.       Edd Arbour, CNM, MSN, IBCLC Certified Nurse Midwife, [facility]

## 2023-10-02 NOTE — MAU Note (Signed)
.  Nicole Vance is a 37 y.o. at [redacted]w[redacted]d here in MAU reporting: Arriving POV from [facility] [place] ED. Patient seen in MAU yesterday and dx with a MAB. Patient sent home with expectant management. Patient presented to [facility] ED with c/o increased vaginal bleeding. She reports she was soaking through three maxi pads in 20 minutes. She reports passing the gestational sac at home. Pelvic exam performed at [facility] with clots removed. She reports she feels as if her vaginal bleeding has decreased since her pelvic exam. She reports an ongoing pain in her lower abdomen that she rates 2/10 and an intermittent cramping she rates 7/10. Desires pain relief. Did not receive anything at the ED.   RAC 20g PIV. Hx 4 prior SABs.  US Impression yesterday: Embryo with no cardiac activity visualized. Crown-rump length (CRL) of ?7 mm and no heartbeat is diagnostic of pregnancy failure.   Onset of complaint: 0430 Pain scores:  2/10 constant lower abdominal pain 7/10 lower abdomen - intermittent  Lab orders placed from triage: none

## 2023-10-02 NOTE — ED Notes (Signed)
Peri care provided and new gown, new sheets, and chucks pads due to bleeding.

## 2023-10-02 NOTE — ED Triage Notes (Signed)
Pt reports she has soaked through 3 pads in last 20 minutes due to having miscarriage. Pt with abd cramping. Pt reports she passed the gestational sac.

## 2023-10-02 NOTE — ED Provider Notes (Signed)
Received patient in turnover from Dr. Bernette Mayers.  Please see their note for further details of Hx, PE.  Briefly patient is a 37 y.o. female with a Miscarriage .  Patient was seen  yesterday.  Vaginal bleeding in first trimester pregnancy.  Has had 4 miscarriages previously.  Watchful waiting was decided with MAU.  Patient came back with worsening bleeding and passage of tissue.  She is not hypotensive.  Case was discussed with the MAU and plans for observation in the emergency department.  Patient still with significant ongoing bleeding.  She does not feel like it is changed at all.  I discussed this with Dr. Berton Lan, she recommended doing a pelvic exam which is not yet been performed in the emergency department.  Pelvic exam with a large amount of clot in the vault.  When I was finally able to remove a large amount of clot I was able to see the cervix and she did have fairly brisk blood pooling.  She continues to be hemodynamically stable.  I discussed with her different possible pathways.  At this point she feels relatively comfortable going to the MAU.  I discussed sending her via ambulance and she would like to go POV.    Melene Plan, DO 10/02/23 [phone]

## 2023-10-02 NOTE — ED Notes (Signed)
MD at bedside for pelvic exam.

## 2023-10-02 NOTE — ED Provider Notes (Signed)
Nicole Vance  Provider Note  CSN: [id number] Arrival date & time: 10/02/23 0550  History Chief Complaint  Patient presents with   Miscarriage    Nicole Vance is a 37 y.o. female was seen in MAU yesterday afternoon for missed AB, about 11wks by LMP found to have 8wk fetus with no FHT on [place]. They discussed cytotec vs D&C but she initially wanted to wait to see how things progressed as she has had several prior early miscarriages as well. During the night she passed some tissue she believes to be the gestational sac. Bleeding continued for the following hour or two soaking two pads prompting her ED visit. She is not having significant pain.    Home Medications Prior to Admission medications   Medication Sig Start Date End Date Taking? Authorizing Provider  albuterol (VENTOLIN HFA) 108 (90 Base) MCG/ACT inhaler Inhale 1 puff into the lungs every 6 (six) hours as needed for wheezing or shortness of breath.    [provider]  ibuprofen (ADVIL) 600 MG tablet Take 1 tablet (600 mg total) by mouth every 6 (six) hours as needed. 10/01/23   Leftwich-Kirby, Wilmer Floor, CNM  misoprostol (CYTOTEC) 200 MCG tablet Place all 4 tablets in your cheek and let them soften, then swallow them. 10/01/23   Leftwich-Kirby, Wilmer Floor, CNM  Prenatal Vit-Fe Fumarate-FA (PRENATAL MULTIVITAMIN) TABS tablet Take 1 tablet by mouth daily at 12 noon.    [provider]  promethazine (PHENERGAN) 12.5 MG tablet Take 1-2 tablets (12.5-25 mg total) by mouth every 6 (six) hours as needed for nausea or vomiting. 10/01/23   Leftwich-Kirby, Wilmer Floor, CNM  traMADol (ULTRAM) 50 MG tablet Take 1-2 tablets (50-100 mg total) by mouth every 6 (six) hours as needed. Take no more than 6 tablets in a 24 hour period. 10/01/23   Leftwich-Kirby, Wilmer Floor, CNM     Allergies    Cashew nut oil   Review of Systems   Review of Systems Please see HPI for pertinent positives and  negatives  Physical Exam BP (!) 128/92 (BP Location: Right Arm)   Pulse 82   Temp 97.9 F (36.6 C) (Oral)   Resp 18   Ht 5\' 2"  (1.575 m)   Wt 81.2 kg   LMP 07/16/2023   SpO2 100%   BMI 32.74 kg/m   Physical Exam Vitals and nursing note reviewed.  HENT:     Head: Normocephalic.     Nose: Nose normal.  Eyes:     Extraocular Movements: Extraocular movements intact.  Pulmonary:     Effort: Pulmonary effort is normal.  Musculoskeletal:        General: Normal range of motion.     Cervical back: Neck supple.  Skin:    Findings: No rash (on exposed skin).  Neurological:     Mental Status: She is alert and oriented to person, place, and time.  Psychiatric:        Mood and Affect: Mood normal.     ED Results / Procedures / Treatments   EKG None  Procedures Procedures  Medications Ordered in the ED Medications - No data to display  Initial Impression and Plan  Patient here with likely miscarriage. She is hemodynamically stable. Husband reports he was given specimen cup for the tissue and he has it at home. Spoke with Dr. Berton Lan, Ob/Gyn, who recommended we check labs to compare to yesterday but does not need cytotec or repeat US  at this Vance. She recommends we observe her in the ED for improved bleeding.   ED Course   Clinical Course as of 10/02/23 9629  Sat Oct 02, 2023  [id number] Care of the patient signed out at shift change.  [CS]    Clinical Course User Index [CS] Pollyann Savoy, MD     MDM Rules/Calculators/A&P Medical Decision Making Problems Addressed: Completed inevitable abortion: acute illness or injury  Amount and/or Complexity of Data Reviewed Labs: ordered. Decision-making details documented in ED Course.     Final Clinical Impression(s) / ED Diagnoses Final diagnoses:  Completed inevitable abortion    Rx / DC Orders ED Discharge Orders     None        Pollyann Savoy, MD 10/02/23 [phone]

## 2023-10-02 NOTE — ED Notes (Signed)
POV transfer discussed with patient and spouse. Patient informed to go straight to MAU without detour. Appropriate paperwork provided to patient. RAC PIV remains intact for transfer per MD order. IV secured for transfer. Patient/spouse verbalized understanding. Spouse to drive patient. Patient alert, stable, and ambulatory to ED exit.

## 2023-10-04 LAB — GC/CHLAMYDIA PROBE AMP (~~LOC~~) NOT AT ARMC
Chlamydia: NEGATIVE
Comment: NEGATIVE
Comment: NORMAL
Neisseria Gonorrhea: NEGATIVE

## 2023-10-06 LAB — COAG STUDIES INTERP REPORT

## 2023-10-06 LAB — VON WILLEBRAND PANEL
Coagulation Factor VIII: 86 % (ref 56–140)
Ristocetin Co-factor, Plasma: 85 % (ref 50–200)
Von Willebrand Antigen, Plasma: 94 % (ref 50–200)

## 2023-10-07 LAB — ANTIPHOSPHOLIPID SYNDROME EVAL, BLD
Anticardiolipin IgA: <9 U/mL (ref 0–11)
Anticardiolipin IgG: <9 [GPL'U]/mL (ref 0–14)
Anticardiolipin IgM: <9 [MPL'U]/mL (ref 0–12)
DRVVT: 41.8 s (ref 0.0–47.0)
PTT Lupus Anticoagulant: 34.4 s (ref 0.0–43.5)
Phosphatydalserine, IgA: <1 {APS'U} (ref 0–19)
Phosphatydalserine, IgG: 9 U (ref 0–30)
Phosphatydalserine, IgM: 20 U (ref 0–30)

## 2023-12-08 NOTE — L&D Delivery Note (Signed)
 Delivery Note Nicole Vance is a 38 y.o. G6 now P1152 who had a spontaneous delivery [redacted]w[redacted]d after IOL.  At 05:34am a healthy baby girl was delivered via  (Presentation: vertex; OA  ).  APGAR: 5, 8; weight pending at the time of note writing .     Admitted for gHTN at term, dx with pre-E with SF by BPs. Induced with cervical ripening balloon, pitocin , and AROM. Progressed normally. Received epidural for pain management. Pushed for 14 minutes. At the time of crowning, the baby's heart rate was noted to be in the 60s, so Ritgen's maneuver was performed. Baby was delivered without difficulty. One nuchal cord was noted and reduced at the perineum after delivery of the baby's body. Baby was placed on the maternal abdomen were routine bulb suction and stimulation was performed.  Delayed cord clamping for 60 seconds. I clamped the umbilical cord and [name] cut it. Baby was taken to the warmer for assessment by NICU staff. Cord blood was collected. Delivery of placenta was spontaneous. Placenta was found to be intact, 3 -vessel cord was noted. The fundus was found to be firm. 2nd degree perineal and left labia minora laceration was repaired in the normal sterile fashion with 3-0 vicryl. Estimated blood loss 340cc. Cord gases not sent. Instrument and gauze counts were correct at the end of the procedure. Fundus was firm, bleeding was minimal,  and both mother and baby were doing well when I left the room, baby was on maternal chest at that time.   Placenta status: L&D. Anesthesia:  epidural Episiotomy:  none Lacerations:  2nd degree perineal and left labia minora Suture Repair: 3.0 vicryl Est. Blood Loss (mL):  340cc  Mom to postpartum.  Baby to Couplet care / Skin to Skin.  [name] A Odyssey Vasbinder 10/24/2024, 6:04 AM

## 2024-04-18 LAB — OB RESULTS CONSOLE RPR: RPR: NONREACTIVE

## 2024-04-18 LAB — OB RESULTS CONSOLE HIV ANTIBODY (ROUTINE TESTING): HIV: NONREACTIVE

## 2024-04-18 LAB — OB RESULTS CONSOLE HEPATITIS B SURFACE ANTIGEN: Hepatitis B Surface Ag: NEGATIVE

## 2024-04-18 LAB — OB RESULTS CONSOLE GC/CHLAMYDIA
Chlamydia: NEGATIVE
Neisseria Gonorrhea: NEGATIVE

## 2024-04-18 LAB — HEPATITIS C ANTIBODY: HCV Ab: NEGATIVE

## 2024-04-18 LAB — OB RESULTS CONSOLE RUBELLA ANTIBODY, IGM: Rubella: IMMUNE

## 2024-04-18 LAB — OB RESULTS CONSOLE ANTIBODY SCREEN: Antibody Screen: NEGATIVE

## 2024-06-09 NOTE — Progress Notes (Signed)
 Subjective:   Nicole Vance is a 38 y.o. female who presents for evaluation of nausea, cramps, and diarrhea. Symptoms have been present for 2 days. She also reports some pain in the right flank area. Associated symptoms includes rhinorrhea, frequency. Patient denies fever, chills, sore throat, cough, vomiting, blood or mucus in stool, dysuria. Patient's oral intake has been normal. Patient's urine output has been adequate. Other contacts with similar symptoms include: none. Patient denies recent travel history. Patient has not had recent ingestion of possible contaminated food, toxic plants, or inappropriate medications/poisons.   ROS Denies fever, chills, headache, body ache Denies nasal and sinus congestion, sneezing, ear pain, sore throat, watery eye, anosmia, ageusia Denies cough, dyspnea, chest pain, diaphoresis, leg swelling Denies vomiting, constipation Denies urgency, dysuria Denies rash A complete ROS was performed with postive/negative pertinent findings listed in the HPI. All other systems are negative.    The following portions of the patient's history were reviewed and updated as appropriate: allergies, current medications, past family history, past medical history, past social history, past surgical history, and problem list.  Allergies Allergen Reactions  . Cashew Nut Swelling, Hives and Other (See Comments)    Oral Mucosal Swelling  . Pistachio Nut Swelling, Hives and Other (See Comments)    Oral Mucosal Swelling    Current Outpatient Medications:  .  albuterol HFA (PROVENTIL HFA;VENTOLIN HFA;PROAIR HFA) 90 mcg/actuation inhaler, Inhale. (Patient not taking: Reported on 06/09/2024), Disp: , Rfl:  .  clobetasoL (TEMOVATE) 0.05 % scalp solution, , Disp: , Rfl:  .  desloratadine-pseudoephedrine (Clarinex-D 12 HOUR) 2.5-120 mg TM12, Take 1 tablet by mouth 2 (two) times a day for 10 days. (Patient not taking: Reported on 06/09/2024), Disp: 20 tablet, Rfl: 1     Objective: BP  136/89   Pulse 109   Temp 98.2 F (36.8 C) (Temporal)   Resp 16   Ht 1.575 m (5' 2)   Wt 89.2 kg (196 lb 9.6 oz)   LMP 01/03/2024   SpO2 98%   BMI 35.96 kg/m   Physical Exam Constitutional:      General: She is not in acute distress.    Appearance: She is not ill-appearing.  HENT:     Nose: No rhinorrhea.     Mouth/Throat:     Pharynx: No oropharyngeal exudate or posterior oropharyngeal erythema.   Eyes:     General: No scleral icterus.    Conjunctiva/sclera: Conjunctivae normal.    Cardiovascular:     Rate and Rhythm: Normal rate and regular rhythm.  Pulmonary:     Effort: Pulmonary effort is normal.     Breath sounds: Normal breath sounds.  Abdominal:     General: Bowel sounds are normal. There is no distension or abdominal bruit.     Palpations: Abdomen is soft. There is no hepatomegaly or splenomegaly.     Tenderness: There is abdominal tenderness in the right upper quadrant. There is no right CVA tenderness, left CVA tenderness, guarding or rebound. Positive signs include Murphy's sign. Negative signs include McBurney's sign.   Musculoskeletal:     Right lower leg: No edema.     Left lower leg: No edema.   Skin:    Coloration: Skin is not jaundiced.   Neurological:     Mental Status: She is alert.   Psychiatric:        Mood and Affect: Mood normal.    Urine dipstick shows 1+ for ketones, negative for rest components     Assessment:  1. Urinary frequency  POC Urinalysis Auto without Microscopic      Urine Culture    2. [redacted] weeks gestation of pregnancy (CMD)      3. Generalized abdomen pain with more pain in RUQ pain  Gallstone vs Acute Gastroenteritis  US  Abdomen Limited         Plan:  1. Discussed oral rehydration, reintroduction of solid foods, signs of dehydration. 2. Return or go to emergency department if worsening symptoms, blood or bile, signs of dehydration, diarrhea lasting longer than 5 days or any new concerns. 3. Follow up in  3 days or sooner as needed.

## 2024-08-20 ENCOUNTER — Encounter (HOSPITAL_COMMUNITY): Payer: Self-pay | Admitting: Obstetrics and Gynecology

## 2024-08-20 ENCOUNTER — Inpatient Hospital Stay (HOSPITAL_COMMUNITY)
Admission: AD | Admit: 2024-08-20 | Discharge: 2024-08-20 | Disposition: A | Discharge status: Home / Self Care | Attending: Obstetrics and Gynecology | Admitting: Obstetrics and Gynecology

## 2024-08-20 DIAGNOSIS — R519 Headache, unspecified: Secondary | ICD-10-CM

## 2024-08-20 DIAGNOSIS — O26893 Other specified pregnancy related conditions, third trimester: Secondary | ICD-10-CM

## 2024-08-20 DIAGNOSIS — G44209 Tension-type headache, unspecified, not intractable: Secondary | ICD-10-CM | POA: Diagnosis not present

## 2024-08-20 DIAGNOSIS — Z3A28 28 weeks gestation of pregnancy: Secondary | ICD-10-CM

## 2024-08-20 DIAGNOSIS — O09293 Supervision of pregnancy with other poor reproductive or obstetric history, third trimester: Secondary | ICD-10-CM | POA: Insufficient documentation

## 2024-08-20 DIAGNOSIS — I10 Essential (primary) hypertension: Secondary | ICD-10-CM | POA: Diagnosis present

## 2024-08-20 DIAGNOSIS — R030 Elevated blood-pressure reading, without diagnosis of hypertension: Secondary | ICD-10-CM | POA: Insufficient documentation

## 2024-08-20 HISTORY — DX: Thyrotoxicosis, unspecified without thyrotoxic crisis or storm: E05.90

## 2024-08-20 LAB — URINALYSIS, ROUTINE W REFLEX MICROSCOPIC
Bilirubin Urine: NEGATIVE
Glucose, UA: 50 mg/dL — AB
Hgb urine dipstick: NEGATIVE
Ketones, ur: 5 mg/dL — AB
Leukocytes,Ua: NEGATIVE
Nitrite: NEGATIVE
Protein, ur: NEGATIVE mg/dL
Specific Gravity, Urine: 1.016 (ref 1.005–1.030)
pH: 6 (ref 5.0–8.0)

## 2024-08-20 LAB — COMPREHENSIVE METABOLIC PANEL WITH GFR
ALT: 14 U/L (ref 0–44)
AST: 15 U/L (ref 15–41)
Albumin: 2.8 g/dL — ABNORMAL LOW (ref 3.5–5.0)
Alkaline Phosphatase: 48 U/L (ref 38–126)
Anion gap: 10 (ref 5–15)
BUN: 10 mg/dL (ref 6–20)
CO2: 22 mmol/L (ref 22–32)
Calcium: 9.6 mg/dL (ref 8.9–10.3)
Chloride: 100 mmol/L (ref 98–111)
Creatinine, Ser: 0.62 mg/dL (ref 0.44–1.00)
GFR, Estimated: >60 mL/min (ref >60)
Glucose, Bld: 97 mg/dL (ref 70–99)
Potassium: 3.6 mmol/L (ref 3.5–5.1)
Sodium: 132 mmol/L — ABNORMAL LOW (ref 135–145)
Total Bilirubin: 0.4 mg/dL (ref 0.0–1.2)
Total Protein: 5.8 g/dL — ABNORMAL LOW (ref 6.5–8.1)

## 2024-08-20 LAB — CBC
HCT: 31.9 % — ABNORMAL LOW (ref 36.0–46.0)
Hemoglobin: 11.3 g/dL — ABNORMAL LOW (ref 12.0–15.0)
MCH: 31.5 pg (ref 26.0–34.0)
MCHC: 35.4 g/dL (ref 30.0–36.0)
MCV: 88.9 fL (ref 80.0–100.0)
Platelets: 194 K/uL (ref 150–400)
RBC: 3.59 MIL/uL — ABNORMAL LOW (ref 3.87–5.11)
RDW: 12.1 % (ref 11.5–15.5)
WBC: 7.2 K/uL (ref 4.0–10.5)
nRBC: 0 % (ref 0.0–0.2)

## 2024-08-20 LAB — PROTEIN / CREATININE RATIO, URINE
Creatinine, Urine: 78 mg/dL
Total Protein, Urine: <6 mg/dL

## 2024-08-20 MED ORDER — CYCLOBENZAPRINE HCL 5 MG PO TABS
10.0000 mg | ORAL_TABLET | Freq: Once | ORAL | Status: AC
  Administered 2024-08-20: 10 mg via ORAL
  Filled 2024-08-20: qty 2

## 2024-08-20 MED ORDER — ACETAMINOPHEN-CAFFEINE 500-65 MG PO TABS
2.0000 | ORAL_TABLET | Freq: Once | ORAL | Status: AC
  Administered 2024-08-20: 2 via ORAL
  Filled 2024-08-20: qty 2

## 2024-08-20 NOTE — MAU Note (Signed)
 Nicole Vance is a 38 y.o. at [redacted]w[redacted]d here in MAU reporting:  Pt reports that she has had a headache all day.  Pt states she has been taking her blood pressure due to having a headache.  the highest one was 160/90.  Took tylenol  1pm for headache and it didn't help.   No blurry vision or epigastric pain or abnormal swelling.  No lof, no bleeding. +FM.  Hx of Pre E with last pregnancy.     Vitals:   08/20/24 1912  BP: (!) 146/88  Pulse: (!) 103  Resp: 16  Temp: 98.2 F (36.8 C)  SpO2: 96%     FHT: 149  Lab orders placed from triage: UA

## 2024-08-20 NOTE — Discharge Instructions (Signed)
    [facility]     [facility]                    [facility]                 [facility]    **Purchase ONE of these store-brand Excedrin Tension Headache medications. Take it as directed for relief of headache. DO NOT TAKE AT THE SAME TIME AS TYLENOL /ACETAMINOPHEN **

## 2024-08-20 NOTE — MAU Provider Note (Signed)
 History     CSN: [id number]  Arrival date and time: 08/20/24 1839   Event Date/Time   First Provider Initiated Contact with Patient 08/20/24 2013      Chief Complaint  Patient presents with   Hypertension   HPI [name] is a 38 y.o. year old G65P0141 female at [redacted]w[redacted]d weeks gestation who presents to MAU reporting H/A all day today. She took her BP at home; it was 160/90. She took Tylenol  1000 mg po at 1300 with minimal relief. She reports she took a 2 hour nap, so she is unsure if the medication truly helped or it reduced the pain of the H/A so she could go to sleep. She denies any blurry vision epigastric pain or abnormal swelling. She denies VB or LOF. She has a h/o of PEC with last pregnancy and wanted to get ahead of it now. She receives [facility]; next appt is 08/23/2024. Her spouse is present and contributing to the history taking.    OB History     Gravida  7   Para  1   Term  0   Preterm  1   AB  4   Living  1      SAB  4   IAB  0   Ectopic  0   Multiple      Live Births  1           Past Medical History:  Diagnosis Date   Acne    Asthma in adult, mild intermittent, uncomplicated    with exercise   Cerebral palsy (HCC)    no deficits   Hyperthyroidism     Past Surgical History:  Procedure Laterality Date   oral surgery      Family History  Problem Relation Age of Onset   Diabetes Mellitus II Paternal Grandmother    Diabetes Mellitus II Paternal Grandfather     Social History   Tobacco Use   Smoking status: Never   Smokeless tobacco: Never  Vaping Use   Vaping status: Never Used  Substance Use Topics   Alcohol use: Not Currently    Alcohol/week: 2.0 standard drinks of alcohol    Types: 2 Glasses of wine per week   Drug use: No    Allergies:  Allergies  Allergen Reactions   Cashew Nut Oil Hives and Swelling    Allergy to tree nuts    Medications Prior to Admission  Medication Sig Dispense  Refill Last Dose/Taking   acetaminophen  (TYLENOL ) 325 MG tablet Take 650 mg by mouth every 6 (six) hours as needed.   08/20/2024   levothyroxine (SYNTHROID) 25 MCG tablet Take 25 mcg by mouth daily before breakfast.   08/20/2024   albuterol (VENTOLIN HFA) 108 (90 Base) MCG/ACT inhaler Inhale 1 puff into the lungs every 6 (six) hours as needed for wheezing or shortness of breath.      ibuprofen  (ADVIL ) 600 MG tablet Take 1 tablet (600 mg total) by mouth every 6 (six) hours as needed. 30 tablet 0    misoprostol  (CYTOTEC ) 200 MCG tablet Take 1 tablet (200 mcg total) by mouth 3 (three) times daily for 4 days. 12 tablet 0    Prenatal Vit-Fe Fumarate-FA (PRENATAL MULTIVITAMIN) TABS tablet Take 1 tablet by mouth daily at 12 noon.      promethazine  (PHENERGAN ) 12.5 MG tablet Take 1-2 tablets (12.5-25 mg total) by mouth every 6 (six) hours as needed for nausea or vomiting. 30  tablet 0    traMADol  (ULTRAM ) 50 MG tablet Take 1-2 tablets (50-100 mg total) by mouth every 6 (six) hours as needed. Take no more than 6 tablets in a 24 hour period. 5 tablet 0     Review of Systems  Constitutional: Negative.   HENT: Negative.    Eyes: Negative.   Respiratory: Negative.    Cardiovascular: Negative.   Gastrointestinal: Negative.   Endocrine: Negative.   Musculoskeletal: Negative.   Skin: Negative.   Allergic/Immunologic: Negative.   Neurological:  Positive for headaches (mild to moderate; rated 4-6/10; but not like a migraine).  Hematological: Negative.   Psychiatric/Behavioral: Negative.     Physical Exam   Patient Vitals for the past 24 hrs:  BP Temp Temp src Pulse Resp SpO2 Height Weight  08/20/24 2130 120/77 -- -- 85 16 98 % -- --  08/20/24 2120 -- -- -- -- -- 98 % -- --  08/20/24 2115 116/75 -- -- 84 -- 98 % -- --  08/20/24 2110 -- -- -- -- -- 97 % -- --  08/20/24 2100 128/84 -- -- 89 -- 98 % -- --  08/20/24 2045 (!) 137/95 -- -- 91 -- 98 % -- --  08/20/24 2035 -- -- -- -- -- 99 % -- --   08/20/24 2030 128/80 -- -- 81 -- 97 % -- --  08/20/24 2025 -- -- -- -- -- 99 % -- --  08/20/24 2020 -- -- -- -- -- 98 % -- --  08/20/24 2015 122/70 -- -- 91 -- 96 % -- --  08/20/24 2010 -- -- -- -- -- 97 % -- --  08/20/24 2005 -- -- -- -- -- 96 % -- --  08/20/24 2003 118/66 -- -- 93 -- -- -- --  08/20/24 2000 -- -- -- -- -- 98 % -- --  08/20/24 1955 -- -- -- -- -- 96 % -- --  08/20/24 1950 -- -- -- -- -- 96 % -- --  08/20/24 1945 121/66 -- -- 97 -- 96 % -- --  08/20/24 1940 -- -- -- -- -- 97 % -- --  08/20/24 1930 128/74 -- -- 99 -- 97 % -- --  08/20/24 1927 (!) 146/92 -- -- (!) 101 -- 96 % -- --  08/20/24 1912 (!) 146/88 98.2 F (36.8 C) Oral (!) 103 16 96 % -- --  08/20/24 1854 -- -- -- -- -- -- 5' 2 (1.575 m) 93.4 kg     Physical Exam Vitals and nursing note reviewed.  Constitutional:      Appearance: Normal appearance. She is normal weight.  HENT:     Head: Normocephalic and atraumatic.  Cardiovascular:     Rate and Rhythm: Normal rate.  Pulmonary:     Effort: Pulmonary effort is normal.  Genitourinary:    Comments: Not indicated Musculoskeletal:        General: Normal range of motion.  Skin:    General: Skin is warm and dry.  Neurological:     Mental Status: She is alert and oriented to person, place, and time.  Psychiatric:        Mood and Affect: Mood normal.        Behavior: Behavior normal.        Thought Content: Thought content normal.        Judgment: Judgment normal.    REACTIVE NST - FHR: 150 bpm / moderate variability / accels present / decels absent / TOCO: none MAU Course  Procedures  MDM CCUA CBC CMP P/C Ratio Serial BP's  Excedrin Tension H/A 2 caplets po -- resolved H/A Flexeril  10 mg po -- resolved H/A  Results for orders placed or performed during the hospital encounter of 08/20/24 (from the past 24 hours)  Urinalysis, Routine w reflex microscopic -Urine, Clean Catch     Status: Abnormal   Collection Time: 08/20/24  7:34 PM  Result  Value Ref Range   Color, Urine YELLOW YELLOW   APPearance HAZY (A) CLEAR   Specific Gravity, Urine 1.016 1.005 - 1.030   pH 6.0 5.0 - 8.0   Glucose, UA 50 (A) NEGATIVE mg/dL   Hgb urine dipstick NEGATIVE NEGATIVE   Bilirubin Urine NEGATIVE NEGATIVE   Ketones, ur 5 (A) NEGATIVE mg/dL   Protein, ur NEGATIVE NEGATIVE mg/dL   Nitrite NEGATIVE NEGATIVE   Leukocytes,Ua NEGATIVE NEGATIVE  Protein / creatinine ratio, urine     Status: None   Collection Time: 08/20/24  7:34 PM  Result Value Ref Range   Creatinine, Urine 78 mg/dL   Total Protein, Urine <6 mg/dL   Protein Creatinine Ratio RESULT OUTSIDE REPORTABLE RANGE,  UNABLE TO CALCULATE.       0.00 - 0.15 mg/mg[Cre]  Comprehensive metabolic panel     Status: Abnormal   Collection Time: 08/20/24  8:22 PM  Result Value Ref Range   Sodium 132 (L) 135 - 145 mmol/L   Potassium 3.6 3.5 - 5.1 mmol/L   Chloride 100 98 - 111 mmol/L   CO2 22 22 - 32 mmol/L   Glucose, Bld 97 70 - 99 mg/dL   BUN 10 6 - 20 mg/dL   Creatinine, Ser 9.37 0.44 - 1.00 mg/dL   Calcium  9.6 8.9 - 10.3 mg/dL   Total Protein 5.8 (L) 6.5 - 8.1 g/dL   Albumin 2.8 (L) 3.5 - 5.0 g/dL   AST 15 15 - 41 U/L   ALT 14 0 - 44 U/L   Alkaline Phosphatase 48 38 - 126 U/L   Total Bilirubin 0.4 0.0 - 1.2 mg/dL   GFR, Estimated >39 >39 mL/min   Anion gap 10 5 - 15  CBC     Status: Abnormal   Collection Time: 08/20/24  8:22 PM  Result Value Ref Range   WBC 7.2 4.0 - 10.5 K/uL   RBC 3.59 (L) 3.87 - 5.11 MIL/uL   Hemoglobin 11.3 (L) 12.0 - 15.0 g/dL   HCT 68.0 (L) 63.9 - 53.9 %   MCV 88.9 80.0 - 100.0 fL   MCH 31.5 26.0 - 34.0 pg   MCHC 35.4 30.0 - 36.0 g/dL   RDW 87.8 88.4 - 84.4 %   Platelets 194 150 - 400 K/uL   nRBC 0.0 0.0 - 0.2 %    Assessment and Plan  1. Headache in pregnancy, antepartum, third trimester (Primary) - Advised to buy OTC Excedrin Tension H/A for relief - Form to record H/As  2. Elevated blood pressure reading without diagnosis of hypertension - 3  elevated BP while in MAU, but majority of BPs were WNL - Information provided on how to take BP and BP record sheet   3. History of pre-eclampsia in prior pregnancy, currently pregnant in third trimester  4. [redacted] weeks gestation of pregnancy   - Discharge home - Keep scheduled appt with [facility] on 08/23/2024 - Patient verbalized an understanding of the plan of care and agrees.   Ala Cart, CNM 08/20/2024, 8:13 PM

## 2024-09-04 ENCOUNTER — Encounter (HOSPITAL_COMMUNITY): Payer: Self-pay | Admitting: Obstetrics and Gynecology

## 2024-09-04 ENCOUNTER — Inpatient Hospital Stay (HOSPITAL_COMMUNITY)
Admission: AD | Admit: 2024-09-04 | Discharge: 2024-09-04 | Disposition: A | Discharge status: Home / Self Care | Attending: Family Medicine | Admitting: Family Medicine

## 2024-09-04 ENCOUNTER — Inpatient Hospital Stay (HOSPITAL_COMMUNITY)

## 2024-09-04 DIAGNOSIS — E669 Obesity, unspecified: Secondary | ICD-10-CM

## 2024-09-04 DIAGNOSIS — O99213 Obesity complicating pregnancy, third trimester: Secondary | ICD-10-CM | POA: Diagnosis not present

## 2024-09-04 DIAGNOSIS — Z3A30 30 weeks gestation of pregnancy: Secondary | ICD-10-CM

## 2024-09-04 DIAGNOSIS — O133 Gestational [pregnancy-induced] hypertension without significant proteinuria, third trimester: Secondary | ICD-10-CM

## 2024-09-04 DIAGNOSIS — O09293 Supervision of pregnancy with other poor reproductive or obstetric history, third trimester: Secondary | ICD-10-CM | POA: Insufficient documentation

## 2024-09-04 DIAGNOSIS — Z369 Encounter for antenatal screening, unspecified: Secondary | ICD-10-CM | POA: Diagnosis present

## 2024-09-04 HISTORY — DX: Gestational (pregnancy-induced) hypertension without significant proteinuria, unspecified trimester: O13.9

## 2024-09-04 LAB — CBC
HCT: 36 % (ref 36.0–46.0)
Hemoglobin: 12.7 g/dL (ref 12.0–15.0)
MCH: 31.2 pg (ref 26.0–34.0)
MCHC: 35.3 g/dL (ref 30.0–36.0)
MCV: 88.5 fL (ref 80.0–100.0)
Platelets: 224 10*3/uL (ref 150–400)
RBC: 4.07 MIL/uL (ref 3.87–5.11)
RDW: 12.2 % (ref 11.5–15.5)
WBC: 8.3 10*3/uL (ref 4.0–10.5)
nRBC: 0 % (ref 0.0–0.2)

## 2024-09-04 LAB — COMPREHENSIVE METABOLIC PANEL WITH GFR
ALT: 18 U/L (ref 0–44)
AST: 20 U/L (ref 15–41)
Albumin: 3.1 g/dL — ABNORMAL LOW (ref 3.5–5.0)
Alkaline Phosphatase: 60 U/L (ref 38–126)
Anion gap: 14 (ref 5–15)
BUN: 10 mg/dL (ref 6–20)
CO2: 19 mmol/L — ABNORMAL LOW (ref 22–32)
Calcium: 9.3 mg/dL (ref 8.9–10.3)
Chloride: 99 mmol/L (ref 98–111)
Creatinine, Ser: 0.62 mg/dL (ref 0.44–1.00)
GFR, Estimated: >60 mL/min (ref >60)
Glucose, Bld: 70 mg/dL (ref 70–99)
Potassium: 4.2 mmol/L (ref 3.5–5.1)
Sodium: 132 mmol/L — ABNORMAL LOW (ref 135–145)
Total Bilirubin: 0.4 mg/dL (ref 0.0–1.2)
Total Protein: 6.6 g/dL (ref 6.5–8.1)

## 2024-09-04 LAB — PROTEIN / CREATININE RATIO, URINE
Creatinine, Urine: 43 mg/dL
Total Protein, Urine: <6 mg/dL

## 2024-09-04 NOTE — MAU Note (Signed)
 Nicole Vance is a 38 y.o. at [redacted]w[redacted]d here in MAU reporting: B/P at home 128/82- 157-111. Pt stated she felt light headed at home and a mild headache on and off throughout the morning. Had some decreased fetal movement but drank some pedialyte and baby moving well now.  Headache is gone now without intervention.  Denies any pain or cramping at this time and n vag bleeding or discharge.   LMP:  Onset of complaint: this am  Pain score: 0 Vitals:   09/04/24 1221  BP: (!) 140/100  Pulse: (!) 106  Resp: 18  Temp: 98.5 F (36.9 C)     FHT: 146  Lab orders placed from triage: [facility]

## 2024-09-04 NOTE — MAU Provider Note (Signed)
 History     [id number]  Arrival date and time: 09/04/24 1138    Chief Complaint  Patient presents with   Hypertension     HPI Nicole Vance is a 38 y.o. at [redacted]w[redacted]d with PMHx notable for severe pre-e in last pregnancy, hypothyroidism, who presents for elevated BP's.   Review of outside prenatal records from [facility] (in media tab): normal labs to date in pregnancy, last visit on record at 26 weeks and normal BP's up to that time  Last seen in MAU on 08/20/2024 for headache and elevated BP's, BP resolved with meds and majority of BP's were normal range though did have a few mild range BP's as well  Patient reports today that she has been having elevated BP's again at home as main reason for presentation Otherwise feels fine right now Currently denies headache, vision changes, chest pain, shortness of breath, RUQ pain Some tightening on and off but not really any contractions No bleeding or leaking fluid Fetal movement was a little decreased earlier, but now normal after eating a sandwhich      OB History     Gravida  7   Para  1   Term  0   Preterm  1   AB  4   Living  1      SAB  4   IAB  0   Ectopic  0   Multiple      Live Births  1           Past Medical History:  Diagnosis Date   Acne    Asthma in adult, mild intermittent, uncomplicated    with exercise   Cerebral palsy (HCC)    no deficits   Hyperthyroidism    Pregnancy induced hypertension     Past Surgical History:  Procedure Laterality Date   oral surgery      Family History  Problem Relation Age of Onset   Healthy Mother    Hypertension Father    Diabetes Mellitus II Paternal Grandmother    Diabetes Mellitus II Paternal Grandfather     Social History   Socioeconomic History   Marital status: Married    Spouse name: Not on file   Number of children: Not on file   Years of education: Not on file   Highest education level: Not on file  Occupational  History   Not on file  Tobacco Use   Smoking status: Never   Smokeless tobacco: Never  Vaping Use   Vaping status: Never Used  Substance and Sexual Activity   Alcohol use: Not Currently    Alcohol/week: 2.0 standard drinks of alcohol    Types: 2 Glasses of wine per week    Comment: Social   Drug use: No   Sexual activity: Yes    Partners: Male  Other Topics Concern   Not on file  Social History Narrative   ** Merged History Encounter **       Social Drivers of Corporate investment banker Strain: Not on file  Food Insecurity: Low Risk  (01/14/2024)   Received from [facility]   Hunger Vital Sign    Within the past 12 months, you worried that your food would run out before you got money to buy more: Never true    Within the past 12 months, the food you bought just didn't last and you didn't have money to get more. : Never true  Transportation Needs: No  Transportation Needs (01/14/2024)   Received from [facility]    In the past 12 months, has lack of reliable transportation kept you from medical appointments, meetings, work or from getting things needed for daily living? : No  Physical Activity: Not on file  Stress: Not on file  Social Connections: Unknown (04/17/2022)   Received from [facility]   Social Network    Social Network: Not on file  Intimate Partner Violence: Unknown (03/11/2022)   Received from [facility]   HITS    Physically Hurt: Not on file    Insult or Talk Down To: Not on file    Threaten Physical Harm: Not on file    Scream or Curse: Not on file    Allergies  Allergen Reactions   Cashew Nut Oil Hives and Swelling    Allergy to tree nuts    No current facility-administered medications on file prior to encounter.   Current Outpatient Medications on File Prior to Encounter  Medication Sig Dispense Refill   levothyroxine (SYNTHROID) 25 MCG tablet Take 25 mcg by mouth daily before breakfast.     Prenatal Vit-Fe Fumarate-FA  (PRENATAL MULTIVITAMIN) TABS tablet Take 1 tablet by mouth daily at 12 noon.     acetaminophen  (TYLENOL ) 325 MG tablet Take 650 mg by mouth every 6 (six) hours as needed.     albuterol (VENTOLIN HFA) 108 (90 Base) MCG/ACT inhaler Inhale 1 puff into the lungs every 6 (six) hours as needed for wheezing or shortness of breath.     promethazine  (PHENERGAN ) 12.5 MG tablet Take 1-2 tablets (12.5-25 mg total) by mouth every 6 (six) hours as needed for nausea or vomiting. 30 tablet 0     ROS Pertinent positives and negative per HPI, all others reviewed and negative  Physical Exam   BP 136/83 (BP Location: Right Arm)   Pulse 94   Temp 98.5 F (36.9 C)   Resp 18   Ht 5' 2 (1.575 m)   Wt 94.3 kg   LMP 07/16/2023   SpO2 98%   BMI 38.04 kg/m   Patient Vitals for the past 24 hrs:  BP Temp Pulse Resp SpO2 Height Weight  09/04/24 1350 -- -- -- -- 98 % -- --  09/04/24 1345 136/83 -- 94 -- 97 % -- --  09/04/24 1330 (!) 143/91 -- 99 -- 98 % -- --  09/04/24 1315 (!) 146/87 -- 99 -- 97 % -- --  09/04/24 1300 132/84 -- 99 -- 97 % -- --  09/04/24 1250 -- -- -- -- 96 % -- --  09/04/24 1244 (!) 139/95 -- 98 -- 100 % -- --  09/04/24 1221 (!) 140/100 98.5 F (36.9 C) (!) 106 18 -- 5' 2 (1.575 m) 94.3 kg    Physical Exam Vitals reviewed.  Constitutional:      General: She is not in acute distress.    Appearance: She is well-developed. She is not diaphoretic.  Eyes:     General: No scleral icterus. Pulmonary:     Effort: Pulmonary effort is normal. No respiratory distress.  Abdominal:     General: There is no distension.     Palpations: Abdomen is soft.     Tenderness: There is no abdominal tenderness. There is no guarding or rebound.  Skin:    General: Skin is warm and dry.  Neurological:     Mental Status: She is alert.     Coordination: Coordination normal.  Cervical Exam    Bedside Ultrasound Not performed.  My interpretation: n/a  FHT Baseline: 140 bpm Variability:  Good {> 6 bpm) Accelerations: Reactive Decelerations: Absent Uterine activity: None Cat: I  Labs Results for orders placed or performed during the hospital encounter of 09/04/24 (from the past 24 hours)  CBC     Status: None   Collection Time: 09/04/24 12:14 PM  Result Value Ref Range   WBC 8.3 4.0 - 10.5 K/uL   RBC 4.07 3.87 - 5.11 MIL/uL   Hemoglobin 12.7 12.0 - 15.0 g/dL   HCT 63.9 63.9 - 53.9 %   MCV 88.5 80.0 - 100.0 fL   MCH 31.2 26.0 - 34.0 pg   MCHC 35.3 30.0 - 36.0 g/dL   RDW 87.7 88.4 - 84.4 %   Platelets 224 150 - 400 K/uL   nRBC 0.0 0.0 - 0.2 %  Comprehensive metabolic panel     Status: Abnormal   Collection Time: 09/04/24 12:14 PM  Result Value Ref Range   Sodium 132 (L) 135 - 145 mmol/L   Potassium 4.2 3.5 - 5.1 mmol/L   Chloride 99 98 - 111 mmol/L   CO2 19 (L) 22 - 32 mmol/L   Glucose, Bld 70 70 - 99 mg/dL   BUN 10 6 - 20 mg/dL   Creatinine, Ser 9.37 0.44 - 1.00 mg/dL   Calcium  9.3 8.9 - 10.3 mg/dL   Total Protein 6.6 6.5 - 8.1 g/dL   Albumin 3.1 (L) 3.5 - 5.0 g/dL   AST 20 15 - 41 U/L   ALT 18 0 - 44 U/L   Alkaline Phosphatase 60 38 - 126 U/L   Total Bilirubin 0.4 0.0 - 1.2 mg/dL   GFR, Estimated >39 >39 mL/min   Anion gap 14 5 - 15  Protein / creatinine ratio, urine     Status: None   Collection Time: 09/04/24 12:45 PM  Result Value Ref Range   Creatinine, Urine 43 mg/dL   Total Protein, Urine <6 mg/dL   Protein Creatinine Ratio        0.00 - 0.15 mg/mg[Cre]    Imaging No results found.       MAU Course  Procedures Lab Orders         CBC         Comprehensive metabolic panel         Protein / creatinine ratio, urine    No orders of the defined types were placed in this encounter.  Imaging Orders         US  MFM FETAL BPP WO NON STRESS      MDM Moderate (Level 3-4)  Assessment and Plan  #Gestational Hypertension #[redacted] weeks gestation of pregnancy Discussed now meeting criteria for gestational hypertension. Currently asymptomatic,  no severe features on labs, BPP 8/8 and NST reactive here today. Discussed diagnosis and implication, need for close monitoring and delivery at 37 weeks or sooner. Discussed with Dr. Sudie who will inform [facility]. Discussed PreE/MAU return precautions with patient prior to discharge.   #FWB FHT Cat I NST: Reactive   Dispo: discharged to home in stable condition    Donnice CHRISTELLA Carolus, MD/MPH 09/04/24 2:17 PM  Allergies as of 09/04/2024       Reactions   Cashew Nut Oil Hives, Swelling   Allergy to tree nuts        Medication List     TAKE these medications    acetaminophen  325 MG tablet Commonly known as:  TYLENOL  Take 650 mg by mouth every 6 (six) hours as needed.   albuterol 108 (90 Base) MCG/ACT inhaler Commonly known as: VENTOLIN HFA Inhale 1 puff into the lungs every 6 (six) hours as needed for wheezing or shortness of breath.   levothyroxine 25 MCG tablet Commonly known as: SYNTHROID Take 25 mcg by mouth daily before breakfast.   prenatal multivitamin Tabs tablet Take 1 tablet by mouth daily at 12 noon.   promethazine  12.5 MG tablet Commonly known as: PHENERGAN  Take 1-2 tablets (12.5-25 mg total) by mouth every 6 (six) hours as needed for nausea or vomiting.

## 2024-10-10 LAB — OB RESULTS CONSOLE GBS: GBS: NEGATIVE

## 2024-10-13 ENCOUNTER — Telehealth (HOSPITAL_COMMUNITY): Payer: Self-pay | Admitting: *Deleted

## 2024-10-13 NOTE — Telephone Encounter (Signed)
 Preadmission screen

## 2024-10-15 ENCOUNTER — Inpatient Hospital Stay (HOSPITAL_COMMUNITY)
Admission: AD | Admit: 2024-10-15 | Discharge: 2024-10-15 | Disposition: A | Discharge status: Home / Self Care | Attending: Obstetrics and Gynecology | Admitting: Obstetrics and Gynecology

## 2024-10-15 ENCOUNTER — Encounter (HOSPITAL_COMMUNITY): Payer: Self-pay | Admitting: Obstetrics and Gynecology

## 2024-10-15 DIAGNOSIS — R519 Headache, unspecified: Secondary | ICD-10-CM | POA: Insufficient documentation

## 2024-10-15 DIAGNOSIS — O4703 False labor before 37 completed weeks of gestation, third trimester: Secondary | ICD-10-CM | POA: Insufficient documentation

## 2024-10-15 DIAGNOSIS — Z3A36 36 weeks gestation of pregnancy: Secondary | ICD-10-CM

## 2024-10-15 DIAGNOSIS — O26893 Other specified pregnancy related conditions, third trimester: Secondary | ICD-10-CM | POA: Diagnosis not present

## 2024-10-15 DIAGNOSIS — O133 Gestational [pregnancy-induced] hypertension without significant proteinuria, third trimester: Secondary | ICD-10-CM

## 2024-10-15 LAB — CBC
HCT: 36.4 % (ref 36.0–46.0)
Hemoglobin: 12.9 g/dL (ref 12.0–15.0)
MCH: 31.2 pg (ref 26.0–34.0)
MCHC: 35.4 g/dL (ref 30.0–36.0)
MCV: 88.1 fL (ref 80.0–100.0)
Platelets: 199 K/uL (ref 150–400)
RBC: 4.13 MIL/uL (ref 3.87–5.11)
RDW: 12.8 % (ref 11.5–15.5)
WBC: 8.3 K/uL (ref 4.0–10.5)
nRBC: 0 % (ref 0.0–0.2)

## 2024-10-15 LAB — COMPREHENSIVE METABOLIC PANEL WITH GFR
ALT: 21 U/L (ref 0–44)
AST: 33 U/L (ref 15–41)
Albumin: 2.9 g/dL — ABNORMAL LOW (ref 3.5–5.0)
Alkaline Phosphatase: 95 U/L (ref 38–126)
Anion gap: 12 (ref 5–15)
BUN: 11 mg/dL (ref 6–20)
CO2: 19 mmol/L — ABNORMAL LOW (ref 22–32)
Calcium: 9.3 mg/dL (ref 8.9–10.3)
Chloride: 102 mmol/L (ref 98–111)
Creatinine, Ser: 0.68 mg/dL (ref 0.44–1.00)
GFR, Estimated: >60 mL/min (ref >60)
Glucose, Bld: 73 mg/dL (ref 70–99)
Potassium: 4.2 mmol/L (ref 3.5–5.1)
Sodium: 133 mmol/L — ABNORMAL LOW (ref 135–145)
Total Bilirubin: 0.3 mg/dL (ref 0.0–1.2)
Total Protein: 6.4 g/dL — ABNORMAL LOW (ref 6.5–8.1)

## 2024-10-15 LAB — PROTEIN / CREATININE RATIO, URINE
Creatinine, Urine: 35 mg/dL
Total Protein, Urine: <6 mg/dL

## 2024-10-15 MED ORDER — HYDRALAZINE HCL 20 MG/ML IJ SOLN: 10.0000 mg | INTRAMUSCULAR | Status: DC | PRN

## 2024-10-15 MED ORDER — ACETAMINOPHEN-CAFFEINE 500-65 MG PO TABS
2.0000 | ORAL_TABLET | Freq: Once | ORAL | Status: AC
  Administered 2024-10-15: 2 via ORAL
  Filled 2024-10-15: qty 2

## 2024-10-15 MED ORDER — LABETALOL HCL 5 MG/ML IV SOLN: 20.0000 mg | INTRAVENOUS | Status: DC | PRN

## 2024-10-15 MED ORDER — LABETALOL HCL 5 MG/ML IV SOLN: 40.0000 mg | INTRAVENOUS | Status: DC | PRN

## 2024-10-15 MED ORDER — LABETALOL HCL 5 MG/ML IV SOLN: 80.0000 mg | INTRAVENOUS | Status: DC | PRN

## 2024-10-15 MED ORDER — MAGNESIUM OXIDE -MG SUPPLEMENT 400 (240 MG) MG PO TABS
200.0000 mg | ORAL_TABLET | Freq: Once | ORAL | Status: AC
Start: 2024-10-15 — End: 2024-10-15
  Administered 2024-10-15: 200 mg via ORAL
  Filled 2024-10-15: qty 0.5

## 2024-10-15 NOTE — MAU Provider Note (Signed)
 History     CSN: [id number]  Arrival date and time: 10/15/24 1507   Event Date/Time   First Provider Initiated Contact with Patient 10/15/24 1607      Chief Complaint  Patient presents with   Hypertension   HPI [name] is a 38 y.o. year old [id number] female at [redacted]w[redacted]d weeks gestation who presents to MAU reporting headache most of the weekend; rated 4/10.  She reports improvement with Excedrin tension headache but not completely; last dose was 0730 today.  She has gestational hypertension.  She took her blood pressure after waking up with a headache.  She reports the blood pressure was 161/111 at home this morning.  She reports positive fetal movement. She denies abdominal pain.  She takes Procardia  XL 30 mg daily; last dose 0930.  She receives her prenatal care [facility] per OB/GYN; next appointment 10/16/2024. Her spouse is present and contributing to the history taking.   OB History     Gravida  7   Para  1   Term  0   Preterm  1   AB  5   Living  1      SAB  5   IAB  0   Ectopic  0   Multiple      Live Births  1           Past Medical History:  Diagnosis Date   Acne    Asthma in adult, mild intermittent, uncomplicated    with exercise   Cerebral palsy (HCC)    no deficits   Hyperthyroidism    Pregnancy induced hypertension     Past Surgical History:  Procedure Laterality Date   oral surgery      Family History  Problem Relation Age of Onset   Healthy Mother    Hypertension Father    Diabetes Mellitus II Paternal Grandmother    Diabetes Mellitus II Paternal Grandfather     Social History   Tobacco Use   Smoking status: Never   Smokeless tobacco: Never  Vaping Use   Vaping status: Never Used  Substance Use Topics   Alcohol use: Not Currently    Alcohol/week: 2.0 standard drinks of alcohol    Types: 2 Glasses of wine per week    Comment: Social   Drug use: No    Allergies:  Allergies  Allergen Reactions   Cashew Nut Oil  Hives and Swelling    Allergy to tree nuts    Medications Prior to Admission  Medication Sig Dispense Refill Last Dose/Taking   acetaminophen  (TYLENOL ) 325 MG tablet Take 650 mg by mouth every 6 (six) hours as needed.   10/15/2024   aspirin 81 MG chewable tablet Chew 81 mg by mouth daily.   10/14/2024   cyclobenzaprine  (FLEXERIL ) 10 MG tablet Take 10 mg by mouth 3 (three) times daily as needed for muscle spasms.   10/14/2024   famotidine (PEPCID) 20 MG tablet Take 20 mg by mouth 2 (two) times daily.   Past Week   levothyroxine (SYNTHROID) 25 MCG tablet Take 25 mcg by mouth daily before breakfast.   10/15/2024   NIFEdipine  (PROCARDIA -XL/NIFEDICAL-XL) 30 MG 24 hr tablet Take 30 mg by mouth daily.   Taking   Prenatal Vit-Fe Fumarate-FA (PRENATAL MULTIVITAMIN) TABS tablet Take 1 tablet by mouth daily at 12 noon.   10/15/2024   albuterol (VENTOLIN HFA) 108 (90 Base) MCG/ACT inhaler Inhale 1 puff into the lungs every 6 (six) hours as needed for  wheezing or shortness of breath.      promethazine  (PHENERGAN ) 12.5 MG tablet Take 1-2 tablets (12.5-25 mg total) by mouth every 6 (six) hours as needed for nausea or vomiting. 30 tablet 0     Review of Systems  Constitutional: Negative.   HENT: Negative.    Eyes: Negative.   Respiratory: Negative.    Cardiovascular: Negative.   Gastrointestinal: Negative.   Endocrine: Negative.   Genitourinary:  Positive for pelvic pain (occ. discomfort of UC's; feels like baby si rolling around).  Musculoskeletal: Negative.   Skin: Negative.   Allergic/Immunologic: Negative.   Neurological:  Positive for headaches.  Hematological: Negative.   Psychiatric/Behavioral: Negative.     Physical Exam   Patient Vitals for the past 24 hrs:  BP Temp Pulse Resp  10/15/24 1746 (!) 148/93 -- 96 --  10/15/24 1731 (!) 148/107 -- (!) 101 --  10/15/24 1716 (!) 143/100 -- (!) 106 --  10/15/24 1701 (!) 150/97 -- 94 --  10/15/24 1646 (!) 156/99 -- 97 --  10/15/24 1631 (!)  149/100 -- 100 --  10/15/24 1616 (!) 149/102 -- 97 --  10/15/24 1601 (!) 142/96 -- (!) 105 --  10/15/24 1546 (!) 142/101 -- (!) 110 --  10/15/24 1542 (!) 152/107 98.1 F (36.7 C) (!) 109 18     Physical Exam Vitals and nursing note reviewed.  Constitutional:      Appearance: Normal appearance. She is obese.  Cardiovascular:     Rate and Rhythm: Normal rate.  Pulmonary:     Effort: Pulmonary effort is normal.  Musculoskeletal:        General: Normal range of motion.  Skin:    General: Skin is warm and dry.  Neurological:     Mental Status: She is alert and oriented to person, place, and time.  Psychiatric:        Mood and Affect: Mood normal.        Behavior: Behavior normal.        Thought Content: Thought content normal.        Judgment: Judgment normal.    REACTIVE NST - FHR: 130 bpm / moderate variability / accels present / decels absent / TOCO: regular every 4-7 mins  MAU Course  Procedures  MDM CCUA Start IV CBC CMP P/C Ratio Labetalol  Protocol Serial BP's   Results for orders placed or performed during the hospital encounter of 10/15/24 (from the past 24 hours)  Comprehensive metabolic panel     Status: Abnormal   Collection Time: 10/15/24  4:24 PM  Result Value Ref Range   Sodium 133 (L) 135 - 145 mmol/L   Potassium 4.2 3.5 - 5.1 mmol/L   Chloride 102 98 - 111 mmol/L   CO2 19 (L) 22 - 32 mmol/L   Glucose, Bld 73 70 - 99 mg/dL   BUN 11 6 - 20 mg/dL   Creatinine, Ser 9.31 0.44 - 1.00 mg/dL   Calcium  9.3 8.9 - 10.3 mg/dL   Total Protein 6.4 (L) 6.5 - 8.1 g/dL   Albumin 2.9 (L) 3.5 - 5.0 g/dL   AST 33 15 - 41 U/L   ALT 21 0 - 44 U/L   Alkaline Phosphatase 95 38 - 126 U/L   Total Bilirubin 0.3 0.0 - 1.2 mg/dL   GFR, Estimated >39 >39 mL/min   Anion gap 12 5 - 15  CBC     Status: None   Collection Time: 10/15/24  4:24 PM  Result Value Ref Range  WBC 8.3 4.0 - 10.5 K/uL   RBC 4.13 3.87 - 5.11 MIL/uL   Hemoglobin 12.9 12.0 - 15.0 g/dL   HCT 63.5  63.9 - 53.9 %   MCV 88.1 80.0 - 100.0 fL   MCH 31.2 26.0 - 34.0 pg   MCHC 35.4 30.0 - 36.0 g/dL   RDW 87.1 88.4 - 84.4 %   Platelets 199 150 - 400 K/uL   nRBC 0.0 0.0 - 0.2 %  Protein / creatinine ratio, urine     Status: None   Collection Time: 10/15/24  4:24 PM  Result Value Ref Range   Creatinine, Urine 35 mg/dL   Total Protein, Urine <6 mg/dL   Protein Creatinine Ratio RESULT OUTSIDE REPORTABLE RANGE,  UNABLE TO CALCULATE.       0.00 - 0.15 mg/mg[Cre]      Assessment and Plan  1. Gestational hypertension, third trimester (Primary)  2. Pregnancy headache in third trimester - Relieved with Excedrin Tension H/A and Magnesium  Oxide 200 mg    3. False labor before 37 completed weeks of gestation in third trimester - Information provided on false labor   4. [redacted] weeks gestation of pregnancy   - Discharge home - Keep scheduled appt with [facility] on 10/16/2024 - Patient verbalized an understanding of the plan of care and agrees.   Ala Cart, CNM 10/15/2024, 4:07 PM

## 2024-10-15 NOTE — MAU Note (Signed)
 Nicole Vance is a 38 y.o. at [redacted]w[redacted]d here in MAU reporting: pt has Gestational Hypertension. Has had a headache most of the weekend improves with meds but has not completely gone away.  Reports a b/p 166/111. Good fetal movement. Denies abd pain or cramping.   LMP:  Onset of complaint: today Pain score: 4 Vitals:   10/15/24 1542  BP: (!) 152/107  Pulse: (!) 109  Resp: 18  Temp: 98.1 F (36.7 C)     FHT: 130  Lab orders placed from triage:

## 2024-10-16 ENCOUNTER — Encounter (HOSPITAL_COMMUNITY): Payer: Self-pay | Admitting: *Deleted

## 2024-10-21 ENCOUNTER — Ambulatory Visit
Admission: RE | Admit: 2024-10-21 | Discharge: 2024-10-21 | Disposition: A | Discharge status: Home / Self Care | Source: Ambulatory Visit | Attending: Family Medicine | Admitting: Family Medicine

## 2024-10-21 ENCOUNTER — Other Ambulatory Visit: Payer: Self-pay

## 2024-10-21 VITALS — BP 156/100 | HR 102 | Temp 98.0°F

## 2024-10-21 DIAGNOSIS — J0100 Acute maxillary sinusitis, unspecified: Secondary | ICD-10-CM

## 2024-10-21 MED ORDER — AMOXICILLIN 875 MG PO TABS: 875.0000 mg | ORAL_TABLET | Freq: Two times a day (BID) | ORAL | 0 refills | Status: DC

## 2024-10-21 NOTE — ED Triage Notes (Signed)
 Pt states she has been having sinus pressure, nasal drainage and headache for 1 week. She is having a productive cough over the last couple days. No fevers, she is taking some OTC cold meds. She is [redacted] weeks pregnant and planned for induction on Monday due to gestational HTN.

## 2024-10-21 NOTE — Discharge Instructions (Addendum)
 Advised patient take medication as directed with food to completion.  Encouraged increase daily water intake to 64 ounces per day while taking this medication.  Advised if symptoms worsen and/or unresolved please follow-up with your PCP or here for further evaluation.

## 2024-10-21 NOTE — ED Provider Notes (Signed)
 [facility]    CSN: [id number] Arrival date & time: 10/21/24  0954      History   Chief Complaint Chief Complaint  Patient presents with   Nasal Congestion    I am [redacted] weeks pregnant, got a cold last weekend from my toddler and it's gotten worse (no fever but headache, ears feel full, coughing up phlegm, nasal drainage), want to confirm I don't need to get on antibiotics before I get induced Monday, 11/17 - Entered by patient    HPI Nicole Vance is a 38 y.o. female.   HPI Pleasant 38 year old female presents with nasal congestion for 1 week.  Patient reports she is [redacted] weeks pregnant.  Patient reports that she will be induced on Monday, 10/23/2024 due to previous preeclampsia.  PMH significant for history of severe preeclampsia and hyperthyroidism.  Past Medical History:  Diagnosis Date   Acne    Asthma in adult, mild intermittent, uncomplicated    with exercise   Cerebral palsy (HCC)    no deficits   History of severe pre-eclampsia    Hyperthyroidism    Pregnancy induced hypertension     Patient Active Problem List   Diagnosis Date Noted   Pregnancy 02/19/2021   Preeclampsia, severe, third trimester 02/18/2021    Past Surgical History:  Procedure Laterality Date   oral surgery      OB History     Gravida  7   Para  1   Term  0   Preterm  1   AB  5   Living  1      SAB  5   IAB  0   Ectopic  0   Multiple      Live Births  1            Home Medications    Prior to Admission medications   Medication Sig Start Date End Date Taking? Authorizing Provider  amoxicillin (AMOXIL) 875 MG tablet Take 1 tablet (875 mg total) by mouth 2 (two) times daily for 7 days. 10/21/24 10/28/24 Yes Teddy Sharper, FNP  acetaminophen  (TYLENOL ) 325 MG tablet Take 650 mg by mouth every 6 (six) hours as needed.    [provider]  albuterol (VENTOLIN HFA) 108 (90 Base) MCG/ACT inhaler Inhale 1 puff into the lungs every 6 (six) hours as  needed for wheezing or shortness of breath.    [provider]  aspirin 81 MG chewable tablet Chew 81 mg by mouth daily.    [provider]  cyclobenzaprine  (FLEXERIL ) 10 MG tablet Take 10 mg by mouth 3 (three) times daily as needed for muscle spasms.    [provider]  famotidine (PEPCID) 20 MG tablet Take 20 mg by mouth 2 (two) times daily.    [provider]  levothyroxine (SYNTHROID) 25 MCG tablet Take 25 mcg by mouth daily before breakfast.    [provider]  NIFEdipine  (PROCARDIA -XL/NIFEDICAL-XL) 30 MG 24 hr tablet Take 30 mg by mouth daily.    [provider]  Prenatal Vit-Fe Fumarate-FA (PRENATAL MULTIVITAMIN) TABS tablet Take 1 tablet by mouth daily at 12 noon.    [provider]  promethazine  (PHENERGAN ) 12.5 MG tablet Take 1-2 tablets (12.5-25 mg total) by mouth every 6 (six) hours as needed for nausea or vomiting. 10/01/23   Leftwich-Kirby, [place], CNM    Family History Family History  Problem Relation Age of Onset   Healthy Mother    Hypertension Father  Diabetes Mellitus II Paternal Grandmother    Diabetes Mellitus II Paternal Grandfather     Social History Social History   Tobacco Use   Smoking status: Never   Smokeless tobacco: Never  Vaping Use   Vaping status: Never Used  Substance Use Topics   Alcohol use: Not Currently    Alcohol/week: 2.0 standard drinks of alcohol    Types: 2 Glasses of wine per week    Comment: Social   Drug use: No     Allergies   Cashew nut oil   Review of Systems Review of Systems  HENT:  Positive for congestion, postnasal drip and sinus pressure.   Neurological:  Positive for headaches.  All other systems reviewed and are negative.    Physical Exam Triage Vital Signs ED Triage Vitals  Encounter Vitals Group     BP      Girls Systolic BP Percentile      Girls Diastolic BP Percentile      Boys Systolic BP Percentile      Boys Diastolic BP Percentile       Pulse      Resp      Temp      Temp src      SpO2      Weight      Height      Head Circumference      Peak Flow      Pain Score      Pain Loc      Pain Education      Exclude from Growth Chart    No data found.  Updated Vital Signs BP (!) 156/100 (BP Location: Right Arm) Comment: states this has been her normal at [facility] office  Pulse (!) 102   Temp 98 F (36.7 C) (Oral)   LMP 07/16/2023   SpO2 97%   Visual Acuity Right Eye Distance:   Left Eye Distance:   Bilateral Distance:    Right Eye Near:   Left Eye Near:    Bilateral Near:     Physical Exam Vitals and nursing note reviewed.  Constitutional:      General: She is not in acute distress.    Appearance: Normal appearance. She is obese. She is not ill-appearing.  HENT:     Head: Normocephalic and atraumatic.     Right Ear: Tympanic membrane and external ear normal.     Left Ear: Tympanic membrane and external ear normal.     Nose:     Right Sinus: Maxillary sinus tenderness present.     Left Sinus: Maxillary sinus tenderness present.     Mouth/Throat:     Mouth: Mucous membranes are moist.     Pharynx: Oropharynx is clear.     Comments: Mild clear drainage of posterior oropharynx noted Eyes:     Extraocular Movements: Extraocular movements intact.     Conjunctiva/sclera: Conjunctivae normal.     Pupils: Pupils are equal, round, and reactive to light.  Cardiovascular:     Rate and Rhythm: Normal rate and regular rhythm.     Heart sounds: Normal heart sounds.  Pulmonary:     Effort: Pulmonary effort is normal.     Breath sounds: Normal breath sounds. No wheezing, rhonchi or rales.  Musculoskeletal:        General: Normal range of motion.     Cervical back: Normal range of motion and neck supple.  Skin:    General: Skin is warm and dry.  Neurological:  General: No focal deficit present.     Mental Status: She is alert and oriented to person, place, and time.  Psychiatric:        Mood and Affect: Mood  normal.        Behavior: Behavior normal.      UC Treatments / Results  Labs (all labs ordered are listed, but only abnormal results are displayed) Labs Reviewed - No data to display  EKG   Radiology No results found.  Procedures Procedures (including critical care time)  Medications Ordered in UC Medications - No data to display  Initial Impression / Assessment and Plan / UC Course  I have reviewed the triage vital signs and the nursing notes.  Pertinent labs & imaging results that were available during my care of the patient were reviewed by me and considered in my medical decision making (see chart for details).     MDM: 1.  Acute nonrecurrent maxillary sinusitis-Rx'd amoxicillin 875 mg tablet: Take 1 tablet twice daily x 7 days. Advised patient take medication as directed with food to completion.  Encouraged increase daily water intake to 64 ounces per day while taking this medication.  Advised if symptoms worsen and/or unresolved please follow-up with your PCP or here for further evaluation.  Patient discharged home, hemodynamically stable Final Clinical Impressions(s) / UC Diagnoses   Final diagnoses:  Acute non-recurrent maxillary sinusitis     Discharge Instructions      Advised patient take medication as directed with food to completion.  Encouraged increase daily water intake to 64 ounces per day while taking this medication.  Advised if symptoms worsen and/or unresolved please follow-up with your PCP or here for further evaluation.     ED Prescriptions     Medication Sig Dispense Auth. Provider   amoxicillin (AMOXIL) 875 MG tablet Take 1 tablet (875 mg total) by mouth 2 (two) times daily for 7 days. 14 tablet Kaylor Simenson, FNP      PDMP not reviewed this encounter.   Teddy Sharper, FNP 10/21/24 1133

## 2024-10-23 ENCOUNTER — Inpatient Hospital Stay (HOSPITAL_COMMUNITY): Admitting: Anesthesiology

## 2024-10-23 ENCOUNTER — Other Ambulatory Visit: Payer: Self-pay

## 2024-10-23 ENCOUNTER — Encounter (HOSPITAL_COMMUNITY): Payer: Self-pay | Admitting: Obstetrics and Gynecology

## 2024-10-23 ENCOUNTER — Inpatient Hospital Stay (HOSPITAL_COMMUNITY)

## 2024-10-23 ENCOUNTER — Inpatient Hospital Stay (HOSPITAL_COMMUNITY)
Admission: RE | Admit: 2024-10-23 | Discharge: 2024-10-26 | DRG: 807 | Disposition: A | Payer: Self-pay | Attending: Obstetrics and Gynecology | Admitting: Obstetrics and Gynecology

## 2024-10-23 DIAGNOSIS — Z8249 Family history of ischemic heart disease and other diseases of the circulatory system: Secondary | ICD-10-CM | POA: Diagnosis not present

## 2024-10-23 DIAGNOSIS — O134 Gestational [pregnancy-induced] hypertension without significant proteinuria, complicating childbirth: Principal | ICD-10-CM | POA: Diagnosis present

## 2024-10-23 DIAGNOSIS — O1493 Unspecified pre-eclampsia, third trimester: Secondary | ICD-10-CM | POA: Diagnosis present

## 2024-10-23 DIAGNOSIS — Z79890 Hormone replacement therapy (postmenopausal): Secondary | ICD-10-CM | POA: Diagnosis not present

## 2024-10-23 DIAGNOSIS — E039 Hypothyroidism, unspecified: Secondary | ICD-10-CM | POA: Diagnosis present

## 2024-10-23 DIAGNOSIS — Z833 Family history of diabetes mellitus: Secondary | ICD-10-CM | POA: Diagnosis not present

## 2024-10-23 DIAGNOSIS — Z3A37 37 weeks gestation of pregnancy: Secondary | ICD-10-CM

## 2024-10-23 DIAGNOSIS — O99284 Endocrine, nutritional and metabolic diseases complicating childbirth: Secondary | ICD-10-CM | POA: Diagnosis present

## 2024-10-23 DIAGNOSIS — O139 Gestational [pregnancy-induced] hypertension without significant proteinuria, unspecified trimester: Principal | ICD-10-CM | POA: Diagnosis present

## 2024-10-23 HISTORY — DX: Hypothyroidism, unspecified: E03.9

## 2024-10-23 LAB — COMPREHENSIVE METABOLIC PANEL WITH GFR
ALT: 21 U/L (ref 0–44)
AST: 27 U/L (ref 15–41)
Albumin: 2.8 g/dL — ABNORMAL LOW (ref 3.5–5.0)
Alkaline Phosphatase: 112 U/L (ref 38–126)
Anion gap: 15 (ref 5–15)
BUN: 13 mg/dL (ref 6–20)
CO2: 18 mmol/L — ABNORMAL LOW (ref 22–32)
Calcium: 9.9 mg/dL (ref 8.9–10.3)
Chloride: 102 mmol/L (ref 98–111)
Creatinine, Ser: 0.74 mg/dL (ref 0.44–1.00)
GFR, Estimated: >60 mL/min (ref >60)
Glucose, Bld: 120 mg/dL — ABNORMAL HIGH (ref 70–99)
Potassium: 3.8 mmol/L (ref 3.5–5.1)
Sodium: 135 mmol/L (ref 135–145)
Total Bilirubin: 0.5 mg/dL (ref 0.0–1.2)
Total Protein: 6.4 g/dL — ABNORMAL LOW (ref 6.5–8.1)

## 2024-10-23 LAB — CBC
HCT: 34.9 % — ABNORMAL LOW (ref 36.0–46.0)
Hemoglobin: 12.5 g/dL (ref 12.0–15.0)
MCH: 31.2 pg (ref 26.0–34.0)
MCHC: 35.8 g/dL (ref 30.0–36.0)
MCV: 87 fL (ref 80.0–100.0)
Platelets: 201 K/uL (ref 150–400)
RBC: 4.01 MIL/uL (ref 3.87–5.11)
RDW: 13 % (ref 11.5–15.5)
WBC: 7.8 K/uL (ref 4.0–10.5)
nRBC: 0 % (ref 0.0–0.2)

## 2024-10-23 LAB — TYPE AND SCREEN
ABO/RH(D): O POS
Antibody Screen: NEGATIVE

## 2024-10-23 LAB — RPR: RPR Ser Ql: NONREACTIVE

## 2024-10-23 MED ORDER — LABETALOL HCL 5 MG/ML IV SOLN
20.0000 mg | INTRAVENOUS | Status: DC | PRN
Start: 2024-10-23 — End: 2024-10-26

## 2024-10-23 MED ORDER — LABETALOL HCL 5 MG/ML IV SOLN: 40.0000 mg | INTRAVENOUS | Status: DC | PRN

## 2024-10-23 MED ORDER — AMOXICILLIN 500 MG PO CAPS
500.0000 mg | ORAL_CAPSULE | Freq: Three times a day (TID) | ORAL | Status: DC
  Administered 2024-10-23 – 2024-10-26 (×10): 500 mg via ORAL
  Filled 2024-10-23 (×11): qty 1

## 2024-10-23 MED ORDER — PHENYLEPHRINE 80 MCG/ML (10ML) SYRINGE FOR IV PUSH (FOR BLOOD PRESSURE SUPPORT): 80.0000 ug | PREFILLED_SYRINGE | INTRAVENOUS | Status: DC | PRN

## 2024-10-23 MED ORDER — NIFEDIPINE ER OSMOTIC RELEASE 30 MG PO TB24
30.0000 mg | ORAL_TABLET | Freq: Every day | ORAL | Status: DC
  Administered 2024-10-24: 30 mg via ORAL
  Filled 2024-10-23: qty 1

## 2024-10-23 MED ORDER — ONDANSETRON HCL 4 MG/2ML IJ SOLN
4.0000 mg | Freq: Four times a day (QID) | INTRAMUSCULAR | Status: DC | PRN
  Administered 2024-10-23 – 2024-10-24 (×2): 4 mg via INTRAVENOUS
  Filled 2024-10-23 (×2): qty 2

## 2024-10-23 MED ORDER — OXYCODONE-ACETAMINOPHEN 5-325 MG PO TABS: 2.0000 | ORAL_TABLET | ORAL | Status: DC | PRN

## 2024-10-23 MED ORDER — MAGNESIUM SULFATE BOLUS VIA INFUSION
4.0000 g | Freq: Once | INTRAVENOUS | Status: AC
  Administered 2024-10-23: 4 g via INTRAVENOUS
  Filled 2024-10-23: qty 1000

## 2024-10-23 MED ORDER — OXYTOCIN BOLUS FROM INFUSION: 333.0000 mL | Freq: Once | INTRAVENOUS | Status: DC

## 2024-10-23 MED ORDER — HYDRALAZINE HCL 20 MG/ML IJ SOLN
5.0000 mg | INTRAMUSCULAR | Status: DC | PRN
  Administered 2024-10-23: 5 mg via INTRAVENOUS

## 2024-10-23 MED ORDER — OXYCODONE-ACETAMINOPHEN 5-325 MG PO TABS: 1.0000 | ORAL_TABLET | ORAL | Status: DC | PRN

## 2024-10-23 MED ORDER — ACETAMINOPHEN 325 MG PO TABS
650.0000 mg | ORAL_TABLET | ORAL | Status: DC | PRN
  Administered 2024-10-23: 650 mg via ORAL
  Filled 2024-10-23: qty 2

## 2024-10-23 MED ORDER — DIPHENHYDRAMINE HCL 50 MG/ML IJ SOLN: 12.5000 mg | INTRAMUSCULAR | Status: DC | PRN

## 2024-10-23 MED ORDER — LACTATED RINGERS IV SOLN: 500.0000 mL | INTRAVENOUS | Status: DC | PRN

## 2024-10-23 MED ORDER — SOD CITRATE-CITRIC ACID 500-334 MG/5ML PO SOLN
30.0000 mL | ORAL | Status: DC | PRN
Start: 2024-10-23 — End: 2024-10-24
  Administered 2024-10-23: 30 mL via ORAL
  Filled 2024-10-23: qty 30

## 2024-10-23 MED ORDER — LIDOCAINE-EPINEPHRINE (PF) 1.5 %-1:200000 IJ SOLN
INTRAMUSCULAR | Status: DC | PRN
  Administered 2024-10-23: 5 mL via EPIDURAL

## 2024-10-23 MED ORDER — FENTANYL CITRATE (PF) 100 MCG/2ML IJ SOLN
50.0000 ug | INTRAMUSCULAR | Status: DC | PRN
  Administered 2024-10-23: 50 ug via INTRAVENOUS
  Filled 2024-10-23: qty 2

## 2024-10-23 MED ORDER — LACTATED RINGERS IV SOLN: 500.0000 mL | Freq: Once | INTRAVENOUS | Status: DC

## 2024-10-23 MED ORDER — LIDOCAINE HCL (PF) 1 % IJ SOLN: 30.0000 mL | INTRAMUSCULAR | Status: DC | PRN

## 2024-10-23 MED ORDER — EPHEDRINE 5 MG/ML INJ: 10.0000 mg | INTRAVENOUS | Status: DC | PRN

## 2024-10-23 MED ORDER — TERBUTALINE SULFATE 1 MG/ML IJ SOLN: 0.2500 mg | Freq: Once | INTRAMUSCULAR | Status: DC | PRN

## 2024-10-23 MED ORDER — ONDANSETRON HCL 4 MG/2ML IJ SOLN: 4.0000 mg | Freq: Four times a day (QID) | INTRAMUSCULAR | Status: DC | PRN

## 2024-10-23 MED ORDER — SOD CITRATE-CITRIC ACID 500-334 MG/5ML PO SOLN: 30.0000 mL | ORAL | Status: DC | PRN

## 2024-10-23 MED ORDER — HYDRALAZINE HCL 20 MG/ML IJ SOLN: 10.0000 mg | INTRAMUSCULAR | Status: DC | PRN

## 2024-10-23 MED ORDER — OXYTOCIN-SODIUM CHLORIDE 30-0.9 UT/500ML-% IV SOLN
2.5000 [IU]/h | INTRAVENOUS | Status: DC
  Administered 2024-10-24: 2.5 [IU]/h via INTRAVENOUS
  Filled 2024-10-23: qty 500

## 2024-10-23 MED ORDER — LACTATED RINGERS IV SOLN: INTRAVENOUS | Status: DC

## 2024-10-23 MED ORDER — LACTATED RINGERS IV SOLN
500.0000 mL | INTRAVENOUS | Status: DC | PRN
  Administered 2024-10-23: 500 mL via INTRAVENOUS

## 2024-10-23 MED ORDER — FENTANYL-BUPIVACAINE-NACL 0.5-0.125-0.9 MG/250ML-% EP SOLN
12.0000 mL/h | EPIDURAL | Status: DC | PRN
  Filled 2024-10-23: qty 250

## 2024-10-23 MED ORDER — OXYTOCIN BOLUS FROM INFUSION
333.0000 mL | Freq: Once | INTRAVENOUS | Status: AC
  Administered 2024-10-24: 333 mL via INTRAVENOUS

## 2024-10-23 MED ORDER — OXYTOCIN-SODIUM CHLORIDE 30-0.9 UT/500ML-% IV SOLN: 1.0000 m[IU]/min | INTRAVENOUS | Status: DC

## 2024-10-23 MED ORDER — PHENYLEPHRINE 80 MCG/ML (10ML) SYRINGE FOR IV PUSH (FOR BLOOD PRESSURE SUPPORT)
80.0000 ug | PREFILLED_SYRINGE | INTRAVENOUS | Status: DC | PRN
Start: 2024-10-23 — End: 2024-10-23

## 2024-10-23 MED ORDER — OXYTOCIN-SODIUM CHLORIDE 30-0.9 UT/500ML-% IV SOLN: 2.5000 [IU]/h | INTRAVENOUS | Status: DC

## 2024-10-23 MED ORDER — LEVOTHYROXINE SODIUM 25 MCG PO TABS
25.0000 ug | ORAL_TABLET | Freq: Every day | ORAL | Status: DC
  Administered 2024-10-24 – 2024-10-26 (×3): 25 ug via ORAL
  Filled 2024-10-23 (×3): qty 1

## 2024-10-23 MED ORDER — OXYTOCIN-SODIUM CHLORIDE 30-0.9 UT/500ML-% IV SOLN
1.0000 m[IU]/min | INTRAVENOUS | Status: DC
  Administered 2024-10-23: 2 m[IU]/min via INTRAVENOUS
  Filled 2024-10-23: qty 500

## 2024-10-23 MED ORDER — LABETALOL HCL 5 MG/ML IV SOLN: 20.0000 mg | INTRAVENOUS | Status: DC | PRN

## 2024-10-23 MED ORDER — EPHEDRINE 5 MG/ML INJ
10.0000 mg | INTRAVENOUS | Status: DC | PRN
Start: 2024-10-23 — End: 2024-10-24

## 2024-10-23 MED ORDER — HYDRALAZINE HCL 20 MG/ML IJ SOLN
5.0000 mg | INTRAMUSCULAR | Status: DC | PRN
  Filled 2024-10-23: qty 1

## 2024-10-23 MED ORDER — MISOPROSTOL 50MCG HALF TABLET: 50.0000 ug | ORAL_TABLET | ORAL | Status: DC | PRN

## 2024-10-23 MED ORDER — EPHEDRINE 5 MG/ML INJ
10.0000 mg | INTRAVENOUS | Status: DC | PRN
Start: 2024-10-23 — End: 2024-10-23

## 2024-10-23 MED ORDER — MAGNESIUM SULFATE 40 GM/1000ML IV SOLN
2.0000 g/h | INTRAVENOUS | Status: DC
  Filled 2024-10-23 (×2): qty 1000

## 2024-10-23 MED ORDER — LACTATED RINGERS IV SOLN: INTRAVENOUS | Status: AC

## 2024-10-23 MED ORDER — FENTANYL-BUPIVACAINE-NACL 0.5-0.125-0.9 MG/250ML-% EP SOLN
12.0000 mL/h | EPIDURAL | Status: DC | PRN
  Administered 2024-10-23: 12 mL/h via EPIDURAL
  Filled 2024-10-23: qty 250

## 2024-10-23 MED ORDER — ACETAMINOPHEN 325 MG PO TABS: 650.0000 mg | ORAL_TABLET | ORAL | Status: DC | PRN

## 2024-10-23 MED ORDER — AMOXICILLIN-POT CLAVULANATE 875-125 MG PO TABS: 1.0000 | ORAL_TABLET | Freq: Two times a day (BID) | ORAL | Status: DC

## 2024-10-23 NOTE — Progress Notes (Signed)
 Subjective: Patient with SRBPs requiring treatment. Went to bedside to discuss new diagnosis of pre-eclampsia with severe features by blood pressures. Patient tearful, initially desires to decline magnesium  treatment. Discussed that she may decline treatment, but that AMA form would need to be signed. After further discussion and counseling, Jayla agrees to magnesium  treatment for preE with SF. All questions answered.  Objective:    10/23/2024    8:51 AM 10/23/2024    8:30 AM 10/23/2024    8:11 AM  Vitals with BMI  Systolic 148 163 837  Diastolic 98 101 115  Pulse 103 99 96     FHT:  FHR: 135 bpm, variability: moderate,  accelerations:  Present,  decelerations:  Absent UC:   none SVE:   Dilation: 2 Effacement (%): 50 Station: -3 Exam by:: Claire, MD  Labs: Lab Results  Component Value Date   WBC 7.8 10/23/2024   HGB 12.5 10/23/2024   HCT 34.9 (L) 10/23/2024   MCV 87.0 10/23/2024   PLT 201 10/23/2024    Assessment / Plan: Malkia M Lehrmann is a 38 y.o. [id number] female at [redacted]w[redacted]d undergoing for medical induction of labor for gestational hypertension, now ruling in for pre-E with SF by Bps. -IOL: Cervical ripening balloon in place, 80/20. Pitocin  per protocol, currently at 2. Patient may have epidural when desires, currently desires IV pain medication. GBS neg. -FWB: Category 1 -pre-E w/SF: ruled in by Bps. Magnesium  infusion ordered, 4g bolus followed by 2g/hr. on nifedipine  30mg  daily, now s/p 5mg  hydral once with good response. Asx, normal admission labs. -hypothyroidism: on Synthroid 25mcg -sinus infection: has been on 875mg  amoxicillin BID for two days. This dose is not available at hospital pharmacy. Will transition to 500mg  TID. -history of exercise induced asthma: has not used albuterol in years. - AMA: Low risk NIPT   Dispo: Anticipate vaginal delivery   Welcome baby girl Hargis. Partner Clem is support person.   Rubie DELENA Claire, MD 10/23/2024, 9:06 AM

## 2024-10-23 NOTE — Anesthesia Procedure Notes (Signed)
 Epidural Patient location during procedure: OB Start time: 10/23/2024 1:00 PM End time: 10/23/2024 1:05 PM  Staffing Anesthesiologist: Dorethea Cordella SQUIBB, DO Performed: anesthesiologist   Preanesthetic Checklist Completed: patient identified, IV checked, site marked, risks and benefits discussed, surgical consent, monitors and equipment checked, pre-op evaluation and timeout performed  Epidural Patient position: sitting Prep: ChloraPrep Patient monitoring: heart rate, continuous pulse ox and blood pressure Approach: midline Location: L4-L5 Injection technique: LOR saline  Needle:  Needle type: Tuohy  Needle gauge: 17 G Needle length: 9 cm Needle insertion depth: 8 cm Catheter type: closed end flexible Catheter size: 20 Guage Catheter at skin depth: 13 cm Test dose: negative and 1.5% lidocaine  with Epi 1:200 K  Assessment Events: blood not aspirated, no cerebrospinal fluid, injection not painful, no injection resistance and no paresthesia  Additional Notes Patient identified. Risks/Benefits/Options discussed with patient including but not limited to bleeding, infection, nerve damage, paralysis, failed block, incomplete pain control, headache, blood pressure changes, nausea, vomiting, reactions to medications, itching and postpartum back pain. Confirmed with bedside nurse the patient's most recent platelet count. Confirmed with patient that they are not currently taking any anticoagulation, have any bleeding history or any family history of bleeding disorders. Patient expressed understanding and wished to proceed. All questions were answered. Sterile technique was used throughout the entire procedure. Please see nursing notes for vital signs. Test dose was given through epidural catheter and negative prior to continuing to dose epidural or start infusion. Warning signs of high block given to the patient including shortness of breath, tingling/numbness in hands, complete motor block,  or any concerning symptoms with instructions to call for help. Patient was given instructions on fall risk and not to get out of bed. All questions and concerns addressed with instructions to call with any issues or inadequate analgesia.    Reason for block:procedure for pain

## 2024-10-23 NOTE — Progress Notes (Signed)
 Subjective: Dalilah is doing well. Feeling some pressure with contractions.  Objective:    10/23/2024    9:31 PM 10/23/2024    9:00 PM 10/23/2024    8:31 PM  Vitals with BMI  Systolic 141 136 866  Diastolic 74 91 87  Pulse 95 99 99     FHT:  FHR: 150 bpm, variability: moderate,  accelerations:  present,  decelerations: variable UC:   q3-45min SVE:   Dilation: 7 Effacement (%): 40 Station: -2 Exam by:: Gavlinkski MD  Labs: Lab Results  Component Value Date   WBC 7.8 10/23/2024   HGB 12.5 10/23/2024   HCT 34.9 (L) 10/23/2024   MCV 87.0 10/23/2024   PLT 201 10/23/2024    Assessment / Plan: Mykenzie M Schoening is a 38 y.o. [id number] female at [redacted]w[redacted]d undergoing for medical induction of labor for gestational hypertension, ruled in for pre-E with SF by Bps. -IOL: s/p CRB. Pitocin  per protocol, currently at 20. AROM performed at 4:30pm, copious clear fluid, IUPC placed at 22:00 without complication. Epidural in place. GBS neg. -FWB: Fetal tachycardia now resolved s/p fluid bolus. Recurrent variables noted now, but with moderate variability and accelerations present, overall reassuring Category 2. Rns to perform position changes.  -pre-E w/SF: ruled in by Bps. Receiving magnesium  infusion. on nifedipine  30mg  daily, s/p 5mg  hydral once with good response. Normal admission labs. Currently asx. -hypothyroidism: on Synthroid 25mcg -sinus infection: has been on 875mg  amoxicillin BID for two days. This dose is not available at hospital pharmacy. Will transition to 500mg  TID. -history of exercise induced asthma: has not used albuterol in years. - AMA: Low risk NIPT   Dispo: Anticipate vaginal delivery   Welcome baby girl Hargis. Partner Clem is support person.   [name], MD 10/23/2024, 10:13 PM

## 2024-10-23 NOTE — Progress Notes (Signed)
 Subjective: Christen is doing well. Her headache is currently resolved. Denies any other symptoms.  Objective:    10/23/2024    8:01 PM 10/23/2024    7:00 PM 10/23/2024    6:32 PM  Vitals with BMI  Systolic 128 135 872  Diastolic 78 82 91  Pulse 101 98 94     FHT:  FHR: 165 bpm, variability: minimal to moderate,  accelerations:  absent,  decelerations:  Absent UC:   q3-58min SVE:   Dilation: 7 Effacement (%): 40 Station: -2 Exam by:: [employer]  Labs: Lab Results  Component Value Date   WBC 7.8 10/23/2024   HGB 12.5 10/23/2024   HCT 34.9 (L) 10/23/2024   MCV 87.0 10/23/2024   PLT 201 10/23/2024    Assessment / Plan: Nesta M Castillo is a 38 y.o. [id number] female at [redacted]w[redacted]d undergoing for medical induction of labor for gestational hypertension, now ruling in for pre-E with SF by Bps. -IOL: s/p CRB. Pitocin  per protocol, currently at 20. AROM performed at 4:30pm, copious clear fluid. Epidural in place. GBS neg. -FWB: Now with 30 minutes of fetal tachycardia and no accelerations; otherwise reassuring, Category 2. Most recent maternal temp 98.40F approximately 1 hour ago. Will give small fluid bolus to avoid fluid overload but to support fetus.  -pre-E w/SF: ruled in by Bps. Receiving magnesium  infusion. on nifedipine  30mg  daily, now s/p 5mg  hydral once with good response. Normal admission labs. Currently asx. -hypothyroidism: on Synthroid 25mcg -sinus infection: has been on 875mg  amoxicillin BID for two days. This dose is not available at hospital pharmacy. Will transition to 500mg  TID. -history of exercise induced asthma: has not used albuterol in years. - AMA: Low risk NIPT   Dispo: Anticipate vaginal delivery   Welcome baby girl Hargis. Partner Clem is support person.   [name], MD 10/23/2024, 8:20 PM

## 2024-10-23 NOTE — Progress Notes (Signed)
 Subjective: Nicole Vance reports that she doesn't feel as badly on the magnesium  this time as she did with her prior induction. She reports that she is comfortable with her epidural, feeling somewhat more on the right side. Denies pre-E symptoms.  Objective:    10/23/2024    2:01 PM 10/23/2024    1:51 PM 10/23/2024    1:41 PM  Vitals with BMI  Systolic 113 134 865  Diastolic 63 59 74  Pulse 101 115 106     FHT:  FHR: 135 bpm, variability: moderate,  accelerations:  Present,  decelerations:  Absent UC:   q3-70min SVE:   Dilation: 5 Effacement (%): 50 Station: -3 Exam by:: J.Cox, RN  Labs: Lab Results  Component Value Date   WBC 7.8 10/23/2024   HGB 12.5 10/23/2024   HCT 34.9 (L) 10/23/2024   MCV 87.0 10/23/2024   PLT 201 10/23/2024    Assessment / Plan: Nicole Vance is a 38 y.o. [id number] female at [redacted]w[redacted]d undergoing for medical induction of labor for gestational hypertension, now ruling in for pre-E with SF by Bps. -IOL: s/p CRB, pitocin  per protocol, currently at 8. AROM deferred at this exam due to fetus not well applied to cervix. Epidural in place. GBS neg. -FWB: Category 1 -pre-E w/SF: ruled in by Bps. Receiving magnesium  infusion. on nifedipine  30mg  daily, now s/p 5mg  hydral once with good response. Asx, normal admission labs. -hypothyroidism: on Synthroid 25mcg -sinus infection: has been on 875mg  amoxicillin BID for two days. This dose is not available at hospital pharmacy. Will transition to 500mg  TID. -history of exercise induced asthma: has not used albuterol in years. - AMA: Low risk NIPT   Dispo: Anticipate vaginal delivery   Welcome baby girl Hargis. Partner Clem is support person.   Nicole DELENA Husky, MD 10/23/2024, 2:04 PM

## 2024-10-23 NOTE — Progress Notes (Signed)
 Subjective: Nicole Vance is doing well. She reports a mild headache that has improved with Tylenol .  Objective:    10/23/2024    4:00 PM 10/23/2024    3:30 PM 10/23/2024    3:01 PM  Vitals with BMI  Systolic 109 119 892  Diastolic 72 73 55  Pulse 101 101 97     FHT:  FHR: 130 bpm, variability: moderate,  accelerations:  Present,  decelerations:  Absent UC:   q3-78min SVE:   Dilation: 5.5 Effacement (%): 50 Station: -2 Exam by:: Gav;inski MD  Labs: Lab Results  Component Value Date   WBC 7.8 10/23/2024   HGB 12.5 10/23/2024   HCT 34.9 (L) 10/23/2024   MCV 87.0 10/23/2024   PLT 201 10/23/2024    Assessment / Plan: Nicole Vance is a 38 y.o. [id number] female at [redacted]w[redacted]d undergoing for medical induction of labor for gestational hypertension, now ruling in for pre-E with SF by Bps. -IOL: s/p CRB. Pitocin  per protocol, currently at 10. AROM performed at 4:30pm, copious clear fluid. Epidural in place. GBS neg. -FWB: Category 1 -pre-E w/SF: ruled in by Bps. Receiving magnesium  infusion. on nifedipine  30mg  daily, now s/p 5mg  hydral once with good response. Normal admission labs. Now with mild headache, responsive to Tylenol . -hypothyroidism: on Synthroid 25mcg -sinus infection: has been on 875mg  amoxicillin BID for two days. This dose is not available at hospital pharmacy. Will transition to 500mg  TID. -history of exercise induced asthma: has not used albuterol in years. - AMA: Low risk NIPT   Dispo: Anticipate vaginal delivery   Welcome baby girl Nicole Vance. Partner Nicole Vance is support person.   Nicole DELENA Husky, MD 10/23/2024, 4:32 PM

## 2024-10-23 NOTE — Anesthesia Preprocedure Evaluation (Signed)
 Anesthesia Evaluation  Patient identified by MRN, date of birth, ID band Patient awake    Reviewed: Allergy & Precautions, NPO status , Patient's Chart, lab work & pertinent test results  Airway Mallampati: II  TM Distance: >3 FB Neck ROM: Full    Dental no notable dental hx.    Pulmonary asthma    Pulmonary exam normal        Cardiovascular hypertension,  Rhythm:Regular Rate:Normal     Neuro/Psych negative neurological ROS  negative psych ROS   GI/Hepatic negative GI ROS, Neg liver ROS,,,  Endo/Other  Hypothyroidism    Renal/GU negative Renal ROS  negative genitourinary   Musculoskeletal negative musculoskeletal ROS (+)    Abdominal Normal abdominal exam  (+)   Peds  Hematology Lab Results      Component                Value               Date                      WBC                      7.8                 10/23/2024                HGB                      12.5                10/23/2024                HCT                      34.9 (L)            10/23/2024                MCV                      87.0                10/23/2024                PLT                      201                 10/23/2024              Anesthesia Other Findings   Reproductive/Obstetrics (+) Pregnancy Pre-E                              Anesthesia Physical Anesthesia Plan  ASA: 3  Anesthesia Plan: Epidural   Post-op Pain Management:    Induction:   PONV Risk Score and Plan: 2 and Treatment may vary due to age or medical condition  Airway Management Planned: Natural Airway  Additional Equipment: None  Intra-op Plan:   Post-operative Plan:   Informed Consent: I have reviewed the patients History and Physical, chart, labs and discussed the procedure including the risks, benefits and alternatives for the proposed anesthesia with the patient or authorized representative who has indicated his/her  understanding and acceptance.     Dental advisory given  Plan Discussed with:   Anesthesia Plan Comments:         Anesthesia Quick Evaluation

## 2024-10-23 NOTE — H&P (Signed)
 Ozetta CHRISTELLA Sieg is a 38 y.o. [phone] female at [redacted]w[redacted]d presenting for medical induction of labor for gestational hypertension. She is taking nifedipine  30mg  daily for her blood pressure. She reports some sinus congestion and mild RUQ pain in the setting of coughing over the weekend. She was diagnosed with a sinus infection and started on amoxicillin on Saturday. She denies headache, vision changes, chest pain, and shortness of breath. She report good fetal movement and denies loss of fluid, vaginal bleeding, or regular contractions. She believes she lost her mucous plug over the weekend.  Her pregnancy is otherwise complicated by: -hypothyroidism: on Synthroid 25mcg -history of exercise induced asthma: has not used albuterol in years. - AMA: Low risk NIPT  OB History     Gravida  7   Para  1   Term  0   Preterm  1   AB  5   Living  1      SAB  5   IAB  0   Ectopic  0   Multiple      Live Births  1          Past Medical History:  Diagnosis Date   Acne    Asthma in adult, mild intermittent, uncomplicated    with exercise   Cerebral palsy (HCC)    no deficits   History of severe pre-eclampsia    Hyperthyroidism    Pregnancy induced hypertension    Past Surgical History:  Procedure Laterality Date   oral surgery     Family History: family history includes Diabetes Mellitus II in her paternal grandfather and paternal grandmother; Healthy in her mother; Hypertension in her father. Social History:  reports that she has never smoked. She has never used smokeless tobacco. She reports that she does not currently use alcohol after a past usage of about 2.0 standard drinks of alcohol per week. She reports that she does not use drugs.     Maternal Diabetes: No Genetic Screening: Normal Maternal Ultrasounds/Referrals: Normal Fetal Ultrasounds or other Referrals:  None Maternal Substance Abuse:  No Significant Maternal Medications:  Meds include: Other: nifedipine ,  synthroid Significant Maternal Lab Results:  Group B Strep negative Number of Prenatal Visits:greater than 3 verified prenatal visits Maternal Vaccinations:RSV: Given during pregnancy >/=14 days ago, TDap, and Flu   Review of Systems  All other systems reviewed and are negative.  Maternal Medical History:  Fetal activity: Perceived fetal activity is normal.   Prenatal complications: PIH.   No bleeding, cholelithiasis, HIV, infection, IUGR, nephrolithiasis, oligohydramnios, placental abnormality, polyhydramnios, pre-eclampsia, preterm labor, substance abuse, thrombocytopenia or thrombophilia.   Prenatal Complications - Diabetes: none.   Dilation: 2 Effacement (%): 50 Station: -3 Exam by:: Dailyn Kempner, MD Blood pressure (!) 157/104, pulse (!) 107, temperature 97.6 F (36.4 C), temperature source Oral, resp. rate 16, height 5' 2 (1.575 m), weight 98.4 kg, last menstrual period 07/16/2023, unknown if currently breastfeeding. Maternal Exam:  Abdomen: Patient reports no abdominal tenderness. Estimated fetal weight is 7lb2oz.   Fetal presentation: vertex Introitus: Normal vulva. Pelvis: adequate for delivery.   Cervix: Cervix evaluated by digital exam.     Fetal Exam Fetal Monitor Review: Baseline rate: 145.  Variability: moderate (6-25 bpm).   Pattern: accelerations present.   Fetal State Assessment: Category I - tracings are normal.   Physical Exam Vitals reviewed.  Constitutional:      Appearance: Normal appearance.  HENT:     Head: Normocephalic.     Nose: Nose normal.  Eyes:     Extraocular Movements: Extraocular movements intact.  Cardiovascular:     Comments: Well perfused Pulmonary:     Effort: Pulmonary effort is normal.  Abdominal:     Comments: Gravid, non-tender  Genitourinary:    General: Normal vulva.  Musculoskeletal:        General: Normal range of motion.     Cervical back: Normal range of motion.  Skin:    General: Skin is warm and dry.   Neurological:     General: No focal deficit present.     Mental Status: She is alert and oriented to person, place, and time.  Psychiatric:        Mood and Affect: Mood normal.        Behavior: Behavior normal.        Thought Content: Thought content normal.        Judgment: Judgment normal.     Prenatal labs: ABO, Rh: --/--/O POS (11/17 0645) Antibody: NEG (11/17 0645) Rubella: Immune (05/13 0000) RPR: Nonreactive (05/13 0000)  HBsAg: Negative (05/13 0000)  HIV: Non-reactive (05/13 0000)  GBS: Negative/-- (11/04 0000)   Assessment/Plan: Nykayla M Halbur is a 38 y.o. [id number] female at [redacted]w[redacted]d presenting for medical induction of labor for gestational hypertension. -IOL: Patient desires to avoid cytotec . Cervical ripening balloon placed with chaperone present, 80/20. Pitocin  per protocol. Patient may have epidural when desires, currently desires IV pain medication. GBS neg. -FWB: Category 1 -gHTN: on nifedipine  30mg  daily. Asx, normal admission labs. Two severe range BPs since presentation, in the setting of pain/anxiety, will continue to monitor. If acute treatment is needed, will start magnesium . -hypothyroidism: on Synthroid 25mcg -sinus infection: has been on 875mg  amoxicillin BID for two days. This dose is not available at hospital pharmacy. Will transition to 500mg  TID. -history of exercise induced asthma: has not used albuterol in years. - AMA: Low risk NIPT  Dispo: Anticipate vaginal delivery  Welcome baby girl Hargis. Partner Clem is support person.   Laquita Harlan A Delania Ferg 10/23/2024, 8:03 AM

## 2024-10-24 ENCOUNTER — Encounter (HOSPITAL_COMMUNITY): Payer: Self-pay | Admitting: Obstetrics and Gynecology

## 2024-10-24 LAB — CBC
HCT: 34.8 % — ABNORMAL LOW (ref 36.0–46.0)
Hemoglobin: 12.4 g/dL (ref 12.0–15.0)
MCH: 31.2 pg (ref 26.0–34.0)
MCHC: 35.6 g/dL (ref 30.0–36.0)
MCV: 87.4 fL (ref 80.0–100.0)
Platelets: 171 K/uL (ref 150–400)
RBC: 3.98 MIL/uL (ref 3.87–5.11)
RDW: 12.9 % (ref 11.5–15.5)
WBC: 14.6 K/uL — ABNORMAL HIGH (ref 4.0–10.5)
nRBC: 0 % (ref 0.0–0.2)

## 2024-10-24 MED ORDER — SIMETHICONE 80 MG PO CHEW: 80.0000 mg | CHEWABLE_TABLET | ORAL | Status: DC | PRN

## 2024-10-24 MED ORDER — DOCUSATE SODIUM 100 MG PO CAPS
100.0000 mg | ORAL_CAPSULE | Freq: Two times a day (BID) | ORAL | Status: DC
  Administered 2024-10-26: 100 mg via ORAL
  Filled 2024-10-24 (×3): qty 1

## 2024-10-24 MED ORDER — NIFEDIPINE ER OSMOTIC RELEASE 30 MG PO TB24
30.0000 mg | ORAL_TABLET | Freq: Once | ORAL | Status: AC
  Administered 2024-10-24: 30 mg via ORAL
  Filled 2024-10-24: qty 1

## 2024-10-24 MED ORDER — NIFEDIPINE ER OSMOTIC RELEASE 60 MG PO TB24
60.0000 mg | ORAL_TABLET | Freq: Every day | ORAL | Status: DC
  Administered 2024-10-25 – 2024-10-26 (×2): 60 mg via ORAL
  Filled 2024-10-24 (×2): qty 1

## 2024-10-24 MED ORDER — COCONUT OIL OIL: 1.0000 | TOPICAL_OIL | Status: DC | PRN

## 2024-10-24 MED ORDER — ONDANSETRON HCL 4 MG PO TABS: 4.0000 mg | ORAL_TABLET | ORAL | Status: DC | PRN

## 2024-10-24 MED ORDER — BENZOCAINE-MENTHOL 20-0.5 % EX AERO
1.0000 | INHALATION_SPRAY | CUTANEOUS | Status: DC | PRN
  Filled 2024-10-24: qty 56

## 2024-10-24 MED ORDER — PRENATAL MULTIVITAMIN CH
1.0000 | ORAL_TABLET | Freq: Every day | ORAL | Status: DC
  Administered 2024-10-24 – 2024-10-26 (×3): 1 via ORAL
  Filled 2024-10-24 (×3): qty 1

## 2024-10-24 MED ORDER — MAGNESIUM SULFATE 40 GM/1000ML IV SOLN
2.0000 g/h | INTRAVENOUS | Status: AC
  Administered 2024-10-24: 2 g/h via INTRAVENOUS
  Filled 2024-10-24: qty 1000

## 2024-10-24 MED ORDER — ZOLPIDEM TARTRATE 5 MG PO TABS: 5.0000 mg | ORAL_TABLET | Freq: Every evening | ORAL | Status: DC | PRN

## 2024-10-24 MED ORDER — WITCH HAZEL-GLYCERIN EX PADS: 1.0000 | MEDICATED_PAD | CUTANEOUS | Status: DC | PRN

## 2024-10-24 MED ORDER — IBUPROFEN 600 MG PO TABS
600.0000 mg | ORAL_TABLET | Freq: Four times a day (QID) | ORAL | Status: DC
Start: 2024-10-24 — End: 2024-10-26
  Administered 2024-10-24 – 2024-10-26 (×8): 600 mg via ORAL
  Filled 2024-10-24 (×9): qty 1

## 2024-10-24 MED ORDER — ONDANSETRON HCL 4 MG/2ML IJ SOLN: 4.0000 mg | INTRAMUSCULAR | Status: DC | PRN

## 2024-10-24 MED ORDER — ACETAMINOPHEN 325 MG PO TABS
650.0000 mg | ORAL_TABLET | ORAL | Status: DC | PRN
  Administered 2024-10-24 – 2024-10-26 (×6): 650 mg via ORAL
  Filled 2024-10-24 (×6): qty 2

## 2024-10-24 MED ORDER — DIPHENHYDRAMINE HCL 25 MG PO CAPS: 25.0000 mg | ORAL_CAPSULE | Freq: Four times a day (QID) | ORAL | Status: DC | PRN

## 2024-10-24 MED ORDER — SENNOSIDES-DOCUSATE SODIUM 8.6-50 MG PO TABS
2.0000 | ORAL_TABLET | Freq: Every day | ORAL | Status: DC
  Administered 2024-10-25 – 2024-10-26 (×2): 2 via ORAL
  Filled 2024-10-24 (×2): qty 2

## 2024-10-24 MED ORDER — DIBUCAINE (PERIANAL) 1 % EX OINT: 1.0000 | TOPICAL_OINTMENT | CUTANEOUS | Status: DC | PRN

## 2024-10-24 NOTE — Progress Notes (Signed)
   Vitals:   10/24/24 0919 10/24/24 1015 10/24/24 1130 10/24/24 1225  BP: (!) 142/94   (!) 154/95  Pulse: (!) 103   97  Resp: 18 20 20 18   Temp:    98.2 F (36.8 C)  TempSrc:    Axillary  SpO2: 97%   100%  Weight:      Height:        Last two BPs over goal.  Will give an additional dose of 30 mg of procardia  Xl now and increase daily dose to 60 mg tomorrow morning

## 2024-10-24 NOTE — Progress Notes (Signed)
 Subjective: Mescal is doing well. Had some issues with a hot spot on her right side with her epidural, but is feeling better now. Having some nausea after her amoxicillin, getting treated now with Zofran . No other symptoms.  Objective:    10/24/2024    2:31 AM 10/24/2024    2:01 AM 10/24/2024    1:31 AM  Vitals with BMI  Systolic 133 138 845  Diastolic 84 91 92  Pulse 87 90 92     FHT:  FHR: 155 bpm, variability: moderate,  accelerations:  present,  decelerations: none UC:   q3-56min SVE:   Dilation: 9 Effacement (%): 80 Station: -1 Exam by:: Galvlinksi, MD  Labs: Lab Results  Component Value Date   WBC 7.8 10/23/2024   HGB 12.5 10/23/2024   HCT 34.9 (L) 10/23/2024   MCV 87.0 10/23/2024   PLT 201 10/23/2024    Assessment / Plan: Nicole Vance is a 38 y.o. [id number] female at [redacted]w[redacted]d undergoing for medical induction of labor for gestational hypertension, ruled in for pre-E with SF by Bps. -IOL: s/p CRB. Pitocin  per protocol, currently at 22. AROM performed at 4:30pm, copious clear fluid, IUPC placed at 22:00, contractions inadequate (130-190MVU) since that time. Epidural in place. GBS neg. -FWB: Currently Category 1 with episodes of Cat 2 tracing (variable decels), overall reassuring -pre-E w/SF: ruled in by Bps. Receiving magnesium  infusion. on nifedipine  30mg  daily, s/p 5mg  hydral once with good response. Normal admission labs. Currently asx. -hypothyroidism: on Synthroid 25mcg -sinus infection: has been on 875mg  amoxicillin BID for two days. This dose is not available at hospital pharmacy. Will transition to 500mg  TID. -history of exercise induced asthma: has not used albuterol in years. - AMA: Low risk NIPT   Dispo: Anticipate vaginal delivery   Welcome baby girl Hargis. Partner Clem is support person.   [name], MD 10/24/2024, 3:21 AM

## 2024-10-24 NOTE — Plan of Care (Signed)
  Problem: Education: Goal: Knowledge of General Education information will improve Description: Including pain rating scale, medication(s)/side effects and non-pharmacologic comfort measures Outcome: Progressing   Problem: Health Behavior/Discharge Planning: Goal: Ability to manage health-related needs will improve Outcome: Progressing   Problem: Clinical Measurements: Goal: Ability to maintain clinical measurements within normal limits will improve Outcome: Progressing Goal: Will remain free from infection Outcome: Progressing Goal: Diagnostic test results will improve Outcome: Progressing Goal: Respiratory complications will improve Outcome: Progressing Goal: Cardiovascular complication will be avoided Outcome: Progressing   Problem: Activity: Goal: Risk for activity intolerance will decrease Outcome: Progressing   Problem: Nutrition: Goal: Adequate nutrition will be maintained Outcome: Progressing   Problem: Coping: Goal: Level of anxiety will decrease Outcome: Progressing   Problem: Elimination: Goal: Will not experience complications related to bowel motility Outcome: Progressing Goal: Will not experience complications related to urinary retention Outcome: Progressing   Problem: Pain Managment: Goal: General experience of comfort will improve and/or be controlled Outcome: Progressing   Problem: Safety: Goal: Ability to remain free from injury will improve Outcome: Progressing   Problem: Skin Integrity: Goal: Risk for impaired skin integrity will decrease Outcome: Progressing   Problem: Education: Goal: Knowledge of disease or condition will improve Outcome: Progressing Goal: Knowledge of the prescribed therapeutic regimen will improve Outcome: Progressing   Problem: Fluid Volume: Goal: Peripheral tissue perfusion will improve Outcome: Progressing   Problem: Clinical Measurements: Goal: Complications related to disease process, condition or treatment  will be avoided or minimized Outcome: Progressing   Problem: Education: Goal: Knowledge of condition will improve Outcome: Progressing Goal: Individualized Educational Video(s) Outcome: Progressing Goal: Individualized Newborn Educational Video(s) Outcome: Progressing   Problem: Activity: Goal: Will verbalize the importance of balancing activity with adequate rest periods Outcome: Progressing Goal: Ability to tolerate increased activity will improve Outcome: Progressing   Problem: Coping: Goal: Ability to identify and utilize available resources and services will improve Outcome: Progressing   Problem: Life Cycle: Goal: Chance of risk for complications during the postpartum period will decrease Outcome: Progressing   Problem: Role Relationship: Goal: Ability to demonstrate positive interaction with newborn will improve Outcome: Progressing   Problem: Skin Integrity: Goal: Demonstration of wound healing without infection will improve Outcome: Progressing

## 2024-10-24 NOTE — Progress Notes (Signed)
 Patient just arrived on OBSC.  Is up and voiding.  Most recent BPs since delivery are mild range  Vitals:   10/24/24 0713 10/24/24 0730 10/24/24 0745 10/24/24 0814  BP: (!) 143/89 (!) 145/80 134/89 136/82  Pulse: 98 98 (!) 101 94  Resp:      Temp:      TempSrc:      SpO2:    96%  Weight:      Height:         A/P 38 y.o. [id number] PPD#0 s/p SVD at 37 weeks for GHTN, now with severe preeclampsia  Preeclampsia, severe by BPs.  REceived 1 dose of IV hydralazine  during IOL yesterday.  Started on nifedipine  Xl 30mg  daily.  Will monitor closely today and increase nifedipine  as needed.  Continue magnesium  sulfate for 24 hours.   Hypothyroidism: on synthroid 25 mcg daily Sinus infection: on amoxicillin prescribed prior to admission   [facility] GEFFEL [facility]

## 2024-10-24 NOTE — Anesthesia Postprocedure Evaluation (Signed)
 Anesthesia Post Note  Patient: Nicole Vance  Procedure(s) Performed: AN AD HOC LABOR EPIDURAL     Patient location during evaluation: OB High Risk Anesthesia Type: Epidural Level of consciousness: awake, oriented and awake and alert Pain management: pain level controlled Vital Signs Assessment: post-procedure vital signs reviewed and stable Respiratory status: spontaneous breathing, respiratory function stable and nonlabored ventilation Cardiovascular status: stable Postop Assessment: no headache, adequate PO intake, able to ambulate, patient able to bend at knees and no apparent nausea or vomiting Anesthetic complications: no   No notable events documented.  Last Vitals:  Vitals:   10/24/24 1225 10/24/24 1333  BP: (!) 154/95   Pulse: 97   Resp: 18 17  Temp: 36.8 C   SpO2: 100%     Last Pain:  Vitals:   10/24/24 1408  TempSrc:   PainSc: 3    Pain Goal:                   Derwood Becraft

## 2024-10-25 ENCOUNTER — Encounter (HOSPITAL_COMMUNITY): Payer: Self-pay | Admitting: Obstetrics and Gynecology

## 2024-10-25 LAB — CBC
HCT: 34.2 % — ABNORMAL LOW (ref 36.0–46.0)
Hemoglobin: 12.1 g/dL (ref 12.0–15.0)
MCH: 31.1 pg (ref 26.0–34.0)
MCHC: 35.4 g/dL (ref 30.0–36.0)
MCV: 87.9 fL (ref 80.0–100.0)
Platelets: 203 K/uL (ref 150–400)
RBC: 3.89 MIL/uL (ref 3.87–5.11)
RDW: 13.2 % (ref 11.5–15.5)
WBC: 10.9 K/uL — ABNORMAL HIGH (ref 4.0–10.5)
nRBC: 0 % (ref 0.0–0.2)

## 2024-10-25 LAB — COMPREHENSIVE METABOLIC PANEL WITH GFR
ALT: 17 U/L (ref 0–44)
AST: 21 U/L (ref 15–41)
Albumin: 2.4 g/dL — ABNORMAL LOW (ref 3.5–5.0)
Alkaline Phosphatase: 87 U/L (ref 38–126)
Anion gap: 11 (ref 5–15)
BUN: 6 mg/dL (ref 6–20)
CO2: 23 mmol/L (ref 22–32)
Calcium: 7.5 mg/dL — ABNORMAL LOW (ref 8.9–10.3)
Chloride: 100 mmol/L (ref 98–111)
Creatinine, Ser: 0.66 mg/dL (ref 0.44–1.00)
GFR, Estimated: >60 mL/min (ref >60)
Glucose, Bld: 75 mg/dL (ref 70–99)
Potassium: 4 mmol/L (ref 3.5–5.1)
Sodium: 134 mmol/L — ABNORMAL LOW (ref 135–145)
Total Bilirubin: 0.4 mg/dL (ref 0.0–1.2)
Total Protein: 5.8 g/dL — ABNORMAL LOW (ref 6.5–8.1)

## 2024-10-25 MED ORDER — FAMOTIDINE 20 MG PO TABS
20.0000 mg | ORAL_TABLET | Freq: Two times a day (BID) | ORAL | Status: DC
  Administered 2024-10-25: 20 mg via ORAL
  Filled 2024-10-25 (×2): qty 1

## 2024-10-25 MED ORDER — LABETALOL HCL 100 MG PO TABS: 100.0000 mg | ORAL_TABLET | Freq: Two times a day (BID) | ORAL | Status: DC

## 2024-10-25 MED ORDER — LABETALOL HCL 100 MG PO TABS
100.0000 mg | ORAL_TABLET | Freq: Two times a day (BID) | ORAL | Status: DC
  Administered 2024-10-25 – 2024-10-26 (×2): 100 mg via ORAL
  Filled 2024-10-25 (×2): qty 1

## 2024-10-25 NOTE — Lactation Note (Signed)
 This note was copied from a baby's chart. Lactation Consultation Note  Patient Name: Nicole Vance Today's Date: 10/25/2024 Age:36 hours Reason for consult: Initial assessment;Early term 37-38.6wks;Breastfeeding assistance;Maternal endocrine disorder;Other (Comment) (GHTN on MgSO4 post partum, maternal history of CP)  LC in to visit with P2 Mom of ET infant delivered vaginally. Baby is at a 5% weight loss with  2 voids and 3 stools. Mom was on magnesium  sulfate for GHTN.  Mom's history includes first baby (3 1/2 hrs old) was born LPT and in the NICU.  Mom tried by consistently pumping, but her milk supply was low and she discontinued pumping at about 6 weeks.  Mom desires to breastfeed and pump/bottle feed Evie.  LC arrived to room with Mom in the chair with baby at the breast in cradle hold.  LC noted that baby was sucking with cheek dimpling.  Mom describes latch was uncomfortable.  LC offered to help when LC observed baby to latched to base of nipple only.  LC showed Mom how to safely unlatch baby and noted that nipple was pinched.  LC provided pillow support and demonstrated hand expression.  Mom guided to support her breast in a U hold, and baby's head from ear to ear.  LC showed Mom how to tease baby with nipple to her top lip and waiting to bring baby to the breast until baby's mouth gape wide.  Baby latched with first attempt.  Mom encouraged to continue supporting her breast and supporting baby's head keeping baby's neck straight and chin deep into breast.  Suck pattern improved with deep jaw extensions and pauses.  Baby stayed on for 15 mins and came off contented.  Mom shown how to use alternate breast compression during sucking.  Tried football hold on other breast, but baby not rooting.  Placed baby STS on Mom's chest where she contently fell asleep.  DEBP set up in room, Mom tried one time she said.  LC sized Mom with 18 mm flanges.  LC encouraged Mom to focus on breastfeeding.   Due to her history of low milk supply (not sure if due to NICU admission and LPT infant or truly low milk supply), LC recommended adding pumping AFTER baby breastfeeds at every other feeding.  Plan written on dry erase board- 1- STS  2- hand express/breast massage 3- Offer the breast with feeding cues, often 4- Pump both breasts after every other breastfeeding  5- ask for help prn   Maternal Data Has patient been taught Hand Expression?: Yes Does the patient have breastfeeding experience prior to this delivery?: Yes How long did the patient breastfeed?: 6 weeks  Feeding Mother's Current Feeding Choice: Breast Milk  LATCH Score Latch: Grasps breast easily, tongue down, lips flanged, rhythmical sucking.  Audible Swallowing: Spontaneous and intermittent  Type of Nipple: Everted at rest and after stimulation  Comfort (Breast/Nipple): Soft / non-tender  Hold (Positioning): Assistance needed to correctly position infant at breast and maintain latch.  LATCH Score: 9   Lactation Tools Discussed/Used Tools: Pump;Flanges Flange Size: 18 Breast pump type: Double-Electric Breast Pump Pump Education: Setup, frequency, and cleaning;Milk Storage Reason for Pumping: Already set up in the room, to support milk supply Pumping frequency: Mom encouraged to pump after breastfeeding every other time, or 4 times per 24 hrs  Interventions Interventions: Breast feeding basics reviewed;Assisted with latch;Skin to skin;Breast massage;Hand express;Breast compression;Adjust position;Support pillows;DEBP;Education;LC Services brochure  Discharge Pump: Hands Free;Personal (Baby Buddha from insurance) WIC Program: No  Consult Status  Consult Status: Follow-up Date: 10/26/24 Follow-up type: In-patient    Claudene Aleck BRAVO 10/25/2024, 10:56 AM

## 2024-10-25 NOTE — Progress Notes (Signed)
 Tolerating PO without N.V feeling better with mag off. Ambulating, voiding without issues. Denies PreE symptoms. Most recent BPs since delivery are mild range. Minimal lochia  Vitals:   10/25/24 0300 10/25/24 0445 10/25/24 0536 10/25/24 0810  BP:   (!) 134/91 136/80  Pulse:   96 88  Resp: 16 15 16 17   Temp:   97.7 F (36.5 C) 98.1 F (36.7 C)  TempSrc:   Oral Oral  SpO2:   99% 98%  Weight:      Height:         A/P 38 y.o. [id number] PPD#1 s/p SVD at 37 weeks for GHTN, now with severe preeclampsia  Preeclampsia, severe by BPs.  REceived 1 dose of IV hydralazine  during IOL 11/17.  Started on nifedipine  Xl 30mg  daily  but increased to 60mg  overnight with improved control.  S/p 24hr magnesium  sulfate. Hypothyroidism: on synthroid 25 mcg daily Sinus infection: on amoxicillin prescribed prior to admission   Plan to monitor BP today with hopeful DC in AM tomorrow PPD#2 Lavonia HERO Calisa Luckenbaugh

## 2024-10-25 NOTE — Lactation Note (Signed)
 This note was copied from a baby's chart. Lactation Consultation Note  Patient Name: Girl Stpehanie Montroy Today's Date: 10/25/2024 Age:36 hours  LC attempted to see [name] and infant but family was asleep at this time. LC services will follow up with family in the morning.   Maternal Data    Feeding    LATCH Score                    Lactation Tools Discussed/Used    Interventions    Discharge    Consult Status      Grayce LULLA Batter 10/25/2024, 12:34 AM

## 2024-10-25 NOTE — Plan of Care (Signed)
  Problem: Education: Goal: Knowledge of General Education information will improve Description: Including pain rating scale, medication(s)/side effects and non-pharmacologic comfort measures Outcome: Progressing   Problem: Health Behavior/Discharge Planning: Goal: Ability to manage health-related needs will improve Outcome: Progressing   Problem: Clinical Measurements: Goal: Ability to maintain clinical measurements within normal limits will improve Outcome: Progressing Goal: Will remain free from infection Outcome: Progressing Goal: Diagnostic test results will improve Outcome: Progressing Goal: Respiratory complications will improve Outcome: Progressing Goal: Cardiovascular complication will be avoided Outcome: Progressing   Problem: Activity: Goal: Risk for activity intolerance will decrease Outcome: Progressing   Problem: Nutrition: Goal: Adequate nutrition will be maintained Outcome: Progressing   Problem: Coping: Goal: Level of anxiety will decrease Outcome: Progressing   Problem: Elimination: Goal: Will not experience complications related to bowel motility Outcome: Progressing Goal: Will not experience complications related to urinary retention Outcome: Progressing   Problem: Pain Managment: Goal: General experience of comfort will improve and/or be controlled Outcome: Progressing   Problem: Safety: Goal: Ability to remain free from injury will improve Outcome: Progressing   Problem: Skin Integrity: Goal: Risk for impaired skin integrity will decrease Outcome: Progressing   Problem: Education: Goal: Knowledge of disease or condition will improve Outcome: Progressing Goal: Knowledge of the prescribed therapeutic regimen will improve Outcome: Progressing   Problem: Fluid Volume: Goal: Peripheral tissue perfusion will improve Outcome: Progressing   Problem: Clinical Measurements: Goal: Complications related to disease process, condition or treatment  will be avoided or minimized Outcome: Progressing   Problem: Education: Goal: Knowledge of condition will improve Outcome: Progressing Goal: Individualized Educational Video(s) Outcome: Progressing Goal: Individualized Newborn Educational Video(s) Outcome: Progressing   Problem: Activity: Goal: Will verbalize the importance of balancing activity with adequate rest periods Outcome: Progressing Goal: Ability to tolerate increased activity will improve Outcome: Progressing   Problem: Coping: Goal: Ability to identify and utilize available resources and services will improve Outcome: Progressing   Problem: Life Cycle: Goal: Chance of risk for complications during the postpartum period will decrease Outcome: Progressing   Problem: Role Relationship: Goal: Ability to demonstrate positive interaction with newborn will improve Outcome: Progressing   Problem: Skin Integrity: Goal: Demonstration of wound healing without infection will improve Outcome: Progressing

## 2024-10-26 ENCOUNTER — Other Ambulatory Visit (HOSPITAL_COMMUNITY): Payer: Self-pay

## 2024-10-26 MED ORDER — LABETALOL HCL 100 MG PO TABS: 100.0000 mg | ORAL_TABLET | Freq: Two times a day (BID) | ORAL | 1 refills | Status: DC

## 2024-10-26 MED ORDER — LABETALOL HCL 100 MG PO TABS
100.0000 mg | ORAL_TABLET | Freq: Two times a day (BID) | ORAL | 1 refills | Status: AC
  Filled 2024-10-26: qty 60, 30d supply, fill #0

## 2024-10-26 MED ORDER — NIFEDIPINE ER 60 MG PO TB24
60.0000 mg | ORAL_TABLET | Freq: Every day | ORAL | 1 refills | Status: AC
  Filled 2024-10-26: qty 30, 30d supply, fill #0

## 2024-10-26 MED ORDER — NIFEDIPINE ER 60 MG PO TB24: 60.0000 mg | ORAL_TABLET | Freq: Every day | ORAL | 1 refills | Status: DC

## 2024-10-26 NOTE — Lactation Note (Signed)
 This note was copied from a baby's chart. Lactation Consultation Note  Patient Name: Girl Correen Bubolz Today's Date: 10/26/2024 Age:36 hours, P2 , Serum Bili 13.4,  Reason for consult: Follow-up assessment;Early term 37-38.6wks;Infant weight loss;Breastfeeding assistance (9 % weight loss) Due to weight loss, supplementing is to be started today and mom has pumped off 3ml and consented to donor milk, [facility] nurse has ordered it.  LC offered to assist to latch the baby and mom receptive.  LC reviewed basics of latching and recommended the football position so the pillow support would help mom to obtain the consistent depth.  Baby opened wide and LC showed dad how he could help to obtain the depth. Baby fed 14 mins with swallows, which increased with breast compressions and stimulation. Per mom comfortable and the nipple well rounded when the baby released. Latch score 9.  Mom had pumped off 3 ml and they spoon fed it to the baby.  Baby STS with mom and dad washing pump pieces for mom to pump.  LC plan:  Feed with cues and by 3 hours STS , football for now.  Feed for 20 mins and then supplement working up towards 30 ml per feeding.  After the baby is settled , post pump both breast 15 mins , save the milk for the next feeding. The next feeding switch to the other breast and do the same.   Maternal Data Has patient been taught Hand Expression?: Yes Does the patient have breastfeeding experience prior to this delivery?: Yes  Feeding Mother's Current Feeding Choice: Breast Milk and Donor Milk (donor milk to be started by nurse when it comes up for the next feeding)  LATCH Score Latch: Grasps breast easily, tongue down, lips flanged, rhythmical sucking.  Audible Swallowing: Spontaneous and intermittent  Type of Nipple: Everted at rest and after stimulation  Comfort (Breast/Nipple): Soft / non-tender  Hold (Positioning): Assistance needed to correctly position infant at breast and  maintain latch.  LATCH Score: 9   Lactation Tools Discussed/Used Tools: Pump;Flanges Flange Size: 18 Breast pump type: Double-Electric Breast Pump;Manual Pump Education: Setup, frequency, and cleaning;Milk Storage Pumped volume: 3 mL  Interventions Interventions: Breast feeding basics reviewed;Assisted with latch;Skin to skin;Breast massage;Hand express;Pre-pump if needed;Reverse pressure;Breast compression;Adjust position;Support pillows;Position options;Coconut oil;Hand pump;DEBP;Education;LC Services brochure;CDC milk storage guidelines;CDC Guidelines for Breast Pump Cleaning  Discharge Pump: Personal;Hands Free;Manual WIC Program: No  Consult Status Consult Status: Follow-up Date: 10/27/24 Follow-up type: In-patient    Rollene Caldron Aaira Oestreicher 10/26/2024, 1:34 PM

## 2024-10-26 NOTE — Progress Notes (Signed)
 Discharge instructions and prescriptions given to pt. Discussed post vaginal delivery care, signs and symptoms to report to the MD, upcoming appointments, and meds. Pt verbalizes understanding and has no questions or concerns at this time. Pt discharged from hospital in stable condition.

## 2024-10-26 NOTE — Discharge Summary (Signed)
 Postpartum Discharge Summary       Patient Name: Nicole Vance DOB: 07-07-1986 MRN: [id number]  Date of admission: 10/23/2024 Delivery date:10/24/2024 Delivering provider: CLAIRE RAMAN A Date of discharge: 10/26/2024  Admitting diagnosis: Gestational hypertension [O13.9] Pre-eclampsia in third trimester [O14.93] Intrauterine pregnancy: [redacted]w[redacted]d     Secondary diagnosis:  Principal Problem:   Gestational hypertension Active Problems:   Pre-eclampsia in third trimester  Additional problems: Advanced maternal age    Discharge diagnosis: Term Pregnancy Delivered                                              Post partum procedures:Applicable Augmentation: AROM, Pitocin , and IP Foley Complications: None  Hospital course: Induction of Labor With Vaginal Delivery   38 y.o. yo [id number] at [redacted]w[redacted]d was admitted to the hospital 10/23/2024 for induction of labor.  Indication for induction: Gestational hypertension.  Patient had an labor course complicated by nothing Membrane Rupture Time/Date: 4:27 PM,10/23/2024  Delivery Method:Vaginal, Spontaneous Operative Delivery:N/A Episiotomy: None Lacerations:  Labial;2nd degree Details of delivery can be found in separate delivery note.  Patient had a postpartum course complicated by nothing. Patient is discharged home 10/26/24.  Newborn Data: Birth date:10/24/2024 Birth time:5:34 AM Gender:Female Living status:Living Apgars:5 ,8  Weight:3030 g  Magnesium  Sulfate received: Yes: Seizure prophylaxis BMZ received: No Rhophylac:N/A Immunizations administered: There is no immunization history for the selected administration types on file for this patient.  Physical exam  Vitals:   10/25/24 2342 10/26/24 0529 10/26/24 0530 10/26/24 0849  BP: (!) 145/83  138/84 (!) 149/92  Pulse: 92  88 94  Resp: 16   16  Temp: 97.7 F (36.5 C) 97.8 F (36.6 C)  97.8 F (36.6 C)  TempSrc: Oral Oral  Oral  SpO2: 96% 100% 98% 97%  Weight:       Height:       General: alert, cooperative, and no distress Lochia: appropriate Uterine Fundus: firm DVT Evaluation: No evidence of DVT seen on physical exam. Labs: Lab Results  Component Value Date   WBC 10.9 (H) 10/25/2024   HGB 12.1 10/25/2024   HCT 34.2 (L) 10/25/2024   MCV 87.9 10/25/2024   PLT 203 10/25/2024      Latest Ref Rng & Units 10/25/2024    6:17 AM  CMP  Glucose 70 - 99 mg/dL 75   BUN 6 - 20 mg/dL 6   Creatinine 9.55 - 8.99 mg/dL 9.33   Sodium 864 - 854 mmol/L 134   Potassium 3.5 - 5.1 mmol/L 4.0   Chloride 98 - 111 mmol/L 100   CO2 22 - 32 mmol/L 23   Calcium  8.9 - 10.3 mg/dL 7.5   Total Protein 6.5 - 8.1 g/dL 5.8   Total Bilirubin 0.0 - 1.2 mg/dL 0.4   Alkaline Phos 38 - 126 U/L 87   AST 15 - 41 U/L 21   ALT 0 - 44 U/L 17    Edinburgh Score:    10/25/2024    6:21 PM  Edinburgh Postnatal Depression Scale Screening Tool  I have been able to laugh and see the funny side of things. 0  I have looked forward with enjoyment to things. 0  I have blamed myself unnecessarily when things went wrong. 1  I have been anxious or worried for no good reason. 2  I have felt scared or  panicky for no good reason. 1  Things have been getting on top of me. 1  I have been so unhappy that I have had difficulty sleeping. 1  I have felt sad or miserable. 0  I have been so unhappy that I have been crying. 1  The thought of harming myself has occurred to me. 0  Edinburgh Postnatal Depression Scale Total 7      After visit meds:  Allergies as of 10/26/2024       Reactions   Cashew Nut Oil Hives, Swelling   Allergy to tree nuts        Medication List     STOP taking these medications    acetaminophen  325 MG tablet Commonly known as: TYLENOL    amoxicillin  875 MG tablet Commonly known as: AMOXIL    aspirin 81 MG chewable tablet   cyclobenzaprine  10 MG tablet Commonly known as: FLEXERIL    famotidine  20 MG tablet Commonly known as: PEPCID     promethazine  12.5 MG tablet Commonly known as: PHENERGAN        TAKE these medications    albuterol 108 (90 Base) MCG/ACT inhaler Commonly known as: VENTOLIN HFA Inhale 1 puff into the lungs every 6 (six) hours as needed for wheezing or shortness of breath.   labetalol  100 MG tablet Commonly known as: NORMODYNE  Take 1 tablet (100 mg total) by mouth 2 (two) times daily.   levothyroxine  25 MCG tablet Commonly known as: SYNTHROID  Take 25 mcg by mouth daily before breakfast.   NIFEdipine  60 MG 24 hr tablet Commonly known as: ADALAT  CC Take 1 tablet (60 mg total) by mouth daily. Start taking on: October 27, 2024 What changed:  medication strength how much to take   prenatal multivitamin Tabs tablet Take 1 tablet by mouth daily at 12 noon.               Discharge Care Instructions  (From admission, onward)           Start     Ordered   10/26/24 0000  Discharge wound care:       Comments: For a cesarean delivery: You may wash incision with soap and water.  Do not soak or submerge the incision for 2 weeks. Keep incision dry. You may need to keep a sanitary pad or panty liner between the incision and your clothing for comfort and to keep the incision dry. If you note drainage, increased pain, or increased redness of the incision, then please notify your physician.   10/26/24 1132   10/26/24 0000  If the dressing is still on your incision site when you go home, remove it on the third day after your surgery date. Remove dressing if it begins to fall off, or if it is dirty or damaged before the third day.       Comments: For a cesarean delivery   10/26/24 1132             Discharge home in stable condition Infant Feeding: Bottle and Breast Infant Disposition:home with mother Discharge instruction: per After Visit Summary and Postpartum booklet. Activity: Advance as tolerated. Pelvic rest for 6 weeks.  Diet: routine diet Anticipated Birth Control:  Unsure Postpartum Appointment:1 week Future Appointments:No future appointments. Follow up Visit:  Follow-up Information     Okey Leader, MD Follow up on 10/31/2024.   Specialty: Obstetrics and Gynecology Why: You are scheduled for a follow-up blood pressure check at 3:00 on 10/31/2024 Contact information: [address]  [place] [address] [phone]                     10/26/2024 Marjorie Gull, MD

## 2024-10-26 NOTE — Progress Notes (Signed)
 Of note, patient asx from BP standpoint. Labetalol  100mg  BID added yesterday afternoon given elevated Bps 140s/90s. Has been normotensive since BP 138/84   Pulse 88   Temp 97.8 F (36.6 C) (Oral)   Resp 16   Ht 5' 2 (1.575 m)   Wt 98.4 kg   LMP 07/16/2023   SpO2 98%   Breastfeeding Unknown   BMI 39.67 kg/m

## 2024-11-06 ENCOUNTER — Telehealth (HOSPITAL_COMMUNITY): Payer: Self-pay | Admitting: *Deleted

## 2024-11-06 NOTE — Telephone Encounter (Signed)
 11/06/2024  Name: Nicole Vance MRN: [id number] DOB: 03-May-1986  Reason for Call:  Transition of Care Hospital Discharge Call  Contact Status: Patient Contact Status: Message  Language assistant needed:          Follow-Up Questions:    Van Postnatal Depression Scale:  In the Past 7 Days:    PHQ2-9 Depression Scale:     Discharge Follow-up:    Post-discharge interventions: NA  Mliss Sieve, RN 11/06/2024 15:14
